# Patient Record
Sex: Male | Born: 1950 | Race: White | Hispanic: No | Marital: Married
Health system: Southern US, Community
[De-identification: ages and names within clinical notes are randomized; demographics above are authoritative.]

## PROBLEM LIST (undated history)

## (undated) DIAGNOSIS — J181 Lobar pneumonia, unspecified organism: Secondary | ICD-10-CM

## (undated) DIAGNOSIS — I482 Chronic atrial fibrillation, unspecified: Secondary | ICD-10-CM

## (undated) DIAGNOSIS — N183 Chronic kidney disease, stage 3 unspecified: Secondary | ICD-10-CM

## (undated) DIAGNOSIS — J9621 Acute and chronic respiratory failure with hypoxia: Secondary | ICD-10-CM

---

## 2019-11-25 ENCOUNTER — Ambulatory Visit
Admit: 2019-11-25 | Payer: PRIVATE HEALTH INSURANCE | Attending: Student in an Organized Health Care Education/Training Program | Primary: Internal Medicine

## 2019-11-25 ENCOUNTER — Inpatient Hospital Stay: Admit: 2019-11-25 | Discharge: 2019-11-25 | Payer: MEDICARE | Primary: Internal Medicine

## 2019-11-25 DIAGNOSIS — F419 Anxiety disorder, unspecified: Secondary | ICD-10-CM

## 2019-11-25 DIAGNOSIS — F1121 Opioid dependence, in remission: Secondary | ICD-10-CM

## 2019-11-25 NOTE — Group Note
Group Session:  Alcohol/Substance Abuse RecoveryGroup Start Time:    9:00 AM      End Time:  10:30 AMFacilitators:  Renato Gails, LADC This clinician is conducting this visit at a site within our enrolled clinical location.. For this visit the clinician and patient were present via interactive audio & video telecommunications system that permits real-time communications. This visit type was necessary for this patient in place of an in-person visit due to the COVID-19 outbreak. Patient counseled on available options for visit type; Patient elected video visit and was made aware they are able to opt out or refuse this service at any time; Patient consent given for video visit: Yes. The patient?s identity was verified by asking the patient to state their name and date of birth at the start of the encounter.State patient is located in: [place] clinician is appropriately licensed in the above state to provide care for this visit.Other individuals present during the telehealth encounter and their role/relation:other group members, Patrica Duel RNSession Focus Relapse preventionPsychoeducation and group discussion on DBT concept of Wise Mind, rational mind and Emotional mind  Number of Participants 7 Treatment Modality Group Therapy Interventions/Education Relapse Prevention InterventionsEducated clients of the benefits of joining a support groupHelped patient become skilled in recognizing/process/coping with thoughts/feelingsIdentified alternative activities to structure leisure timeIdentified positive outcomes for refraining from substances Attendance            AttendedEngagement InterventionsNot applicable - patient attended group without difficulty Degree of Participation Active Behavior Appropriate Speech/Thought Process Focused Affect/Mood Full range Insight Good Judgment Good Response to Interventions Attentive Individualization [place] - Suicide Severity Screen    Most Recent Value Have you wished you were dead or wished you could go to sleep and not wake up?  No Have you actually had any thoughts of killing yourself?   No Have you ever done anything, started to do anything, or prepared to do anything to end your life?   No [place] Suicide Risk Level  Low Risk  pt reports continued sobriety;compliant with MAT; states he was pleasantly surprised to see that his wife had already cleaned his car off of snow by the time he went to do it; feeling better and less tired since he had his appt with his doctor and the blood tests came back with no major concerns; Plan Continue to assist patient with relapse prevention

## 2019-12-01 ENCOUNTER — Encounter: Admit: 2019-12-01 | Payer: PRIVATE HEALTH INSURANCE | Attending: Psychiatry | Primary: Internal Medicine

## 2019-12-01 MED ORDER — BUPRENORPHINE 4 MG-NALOXONE 1 MG SUBLINGUAL FILM
4-1 mg | ORAL_FILM | Freq: Every day | SUBLINGUAL | 1 refills | Status: AC
Start: 2019-12-01 — End: 2020-01-01

## 2019-12-02 ENCOUNTER — Inpatient Hospital Stay: Admit: 2019-12-02 | Discharge: 2019-12-02 | Payer: MEDICARE | Primary: Internal Medicine

## 2019-12-02 ENCOUNTER — Ambulatory Visit
Admit: 2019-12-02 | Payer: PRIVATE HEALTH INSURANCE | Attending: Student in an Organized Health Care Education/Training Program | Primary: Internal Medicine

## 2019-12-02 DIAGNOSIS — F419 Anxiety disorder, unspecified: Secondary | ICD-10-CM

## 2019-12-02 DIAGNOSIS — F1121 Opioid dependence, in remission: Secondary | ICD-10-CM

## 2019-12-09 ENCOUNTER — Inpatient Hospital Stay: Admit: 2019-12-09 | Discharge: 2019-12-09 | Payer: MEDICARE | Primary: Internal Medicine

## 2019-12-09 ENCOUNTER — Ambulatory Visit
Admit: 2019-12-09 | Payer: PRIVATE HEALTH INSURANCE | Attending: Student in an Organized Health Care Education/Training Program | Primary: Internal Medicine

## 2019-12-09 DIAGNOSIS — F1121 Opioid dependence, in remission: Secondary | ICD-10-CM

## 2019-12-09 NOTE — Group Note
Group Session:  Alcohol/Substance Abuse RecoveryGroup Start Time:    9:00 AM      End Time:  10:30 AMFacilitators:  Renato Gails, LADC This clinician is conducting this visit at a site within our enrolled clinical location.. For this visit the clinician and patient were present via interactive audio & video telecommunications system that permits real-time communications. This visit type was necessary for this patient in place of an in-person visit due to the COVID-19 outbreak. Patient counseled on available options for visit type; Patient elected video visit and was made aware they are able to opt out or refuse this service at any time; Patient consent given for video visit: Yes. The patient?s identity was verified by asking the patient to state their name and date of birth at the start of the encounter.State patient is located in: [place]The clinician is appropriately licensed in the above state to provide care for this visit.Other individuals present during the telehealth encounter and their role/relation:other group membersSession Focus Relapse preventionGroup discussed meaning attribution and how to assign meaning to seemingly discouraging situations to make them easier to cope with Number of Participants 8 Treatment Modality Group Therapy Interventions/Education Relapse Prevention InterventionsEducated clients of the benefits of joining a support groupHelped patient become skilled in recognizing/process/coping with thoughts/feelingsIdentified positive outcomes for refraining from substancesIdentified thoughts/feelings that lead to relapse Attendance            AttendedEngagement InterventionsNot applicable - patient attended group without difficulty Degree of Participation Moderate Behavior Cooperative--pt was lying down for the group- stated he is tired Speech/Thought Process Focused Affect/Mood Full range Insight Moderate Judgment Moderate Response to Interventions Attentive Individualization [place] - Suicide Severity Screen    Most Recent Value Have you wished you were dead or wished you could go to sleep and not wake up?  No Have you actually had any thoughts of killing yourself?   No Have you ever done anything, started to do anything, or prepared to do anything to end your life?   No [place] Suicide Risk Level  Low Risk  Pt reports continued sobriety; states that he is tired physically and of the limitations imposed by COVID and inability to get his vaccine; shared that he misses visiting his friend in th nursing home and fears that he might die there alone Plan Continue to assist patient with relapse prevention

## 2019-12-15 ENCOUNTER — Inpatient Hospital Stay: Admit: 2019-12-15 | Discharge: 2019-12-15 | Payer: MEDICARE | Primary: Internal Medicine

## 2019-12-15 DIAGNOSIS — F1121 Opioid dependence, in remission: Secondary | ICD-10-CM

## 2019-12-15 DIAGNOSIS — F419 Anxiety disorder, unspecified: Secondary | ICD-10-CM

## 2019-12-15 LAB — URINE DRUG SCREEN, MEDTOX     (GH)
BKR AMPHETAMINE: NEGATIVE
BKR BARBITURATES: NEGATIVE
BKR BENZODIAZEPINES: POSITIVE — AB
BKR BUPRENORPHINE: NEGATIVE
BKR COCAINE (BENZOYLECGONINE): NEGATIVE
BKR METHADONE: NEGATIVE
BKR METHAMPHETAMINE: NEGATIVE
BKR OPIATES: NEGATIVE
BKR OXYCODONE: NEGATIVE
BKR PCP, PHENCYCLIDINE: NEGATIVE
BKR PROPOXYPHENE: NEGATIVE
BKR TCA, TRICYCLIC ANTIDEPRESSANTS: NEGATIVE
BKR THC, CANNABINOIDS: NEGATIVE

## 2019-12-15 LAB — ETHANOL, URINE, RANDOM     (BH GH LMW): BKR ETHANOL URINE: NEGATIVE

## 2019-12-15 NOTE — Progress Notes
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 Endorsing euthymic mood, but is bit more tired overall. Reported that he had been using more Librium PRN recently. Counseled to reduce the use. Well tolerating other medications and denies side effects. Denies craving. Patient self-lowered Suboxone to 2/0.5 for a while, still taking 4/1 sometimes. Agreed with the taper and will continue to counsel. Church is a main support for her. Denies acute stress. Denies SI. Sleeps well.REVIEW OF ALLERGIES/MEDICATIONS/HISTORY I have reviewed the patient's current medications and allergiesI have reviewed with the patient, the risks and benefits of the medications prescribed.OBJECTIVE Review of SystemsReview of Systems Constitutional: Negative for activity change and fatigue. Respiratory: Negative for cough.  Skin: Negative for pallor. Psychiatric/Behavioral: Negative for confusion, decreased concentration, dysphoric mood, sleep disturbance and suicidal ideas. The patient is not nervous/anxious.   Labs12/24/2020 urine toxicology positive benzodiazepines, negative buprenorphine.  Labs have periodically tested negative for buprenorphine as patient has been on longitudinally at relatively low dose.  Patient prescribed Librium so would expect benzodiazepines to be positive.Current MedicationsCurrent Outpatient Medications Medication Sig Note ? acetaminophen (TYLENOL) 325 MG tablet Take 650 mg by mouth every 6 (six) hours as needed..  ? buprenorphine-naloxone (SUBOXONE) 4-1 mg per sublingual film Place 1 Film under the tongue daily.  ? buPROPion XL (WELLBUTRIN XL) 300 mg 24 hr tablet Take 1 tablet (300 mg total) by mouth every morning.  ? caffeine 200 mg Tab tablet Take 200 mg by mouth daily.  ? calcium carbonate (TUMS) *200 mg CALCIUM* chewable tablet Take 3 tablets by mouth daily.  ? chlordiazePOXIDE (LIBRIUM) 25 MG capsule Take 25 mg by mouth 2 (two) times daily as needed for Anxiety.  ? clobetasol (TEMOVATE) 0.05 % cream APPLY SPARINGLY TO AFFECTED AREA TWICE A DAY  ? co-enzyme Q-10 30 mg capsule Take 30 mg by mouth daily.  ? escitalopram oxalate (LEXAPRO) 10 mg tablet Take 1 tablet (10 mg total) by mouth every morning.  ? fish oil-omega-3 fatty acids 1,000 mg capsule Take 2 g by mouth daily.  ? furosemide (LASIX) 40 MG tablet TAKE 1 AND 1/2 TABLETS ORALLY DAILY AS DIRECTED 07/23/2016: Received from: External Pharmacy ? glucosamine-chondroitin 500-400 mg tablet Take 2 tablets by mouth daily.  ? MATZIM LA 360 mg LA 24 hr extended release tablet   ? mupirocin (BACTROBAN) 2 % ointment   ? tadalafil (CIALIS) 20 MG tablet Take 20 mg by mouth as needed for Erectile Dysfunction.  ? warfarin (COUMADIN) 6 MG tablet TAKE 1 TABLET BY MOUTH EVERY DAY  No current facility-administered medications for this encounter.  Mental Status ExamGeneral AppearanceHabitus:  MediumGrooming:  GoodMusculoskeletalStrength and Tone: video visit.Gait and Station: Stable posturePsychiatricAttitude: Cooperative and pleasantPsychomotor Behavior: No psychomotor activation or retardationSpeech: normal rate, volume and prosodyMood: Calm and euthymic not depressed, not anxious   Patient reported mood:  okayAffect: speech volume, prosody, emotional content congruent with subject matter. No lability.Thought Process: Coherent, logical, goal directedAssociations: NormalThought Content: NormalSuicidal Ideation: No current suicidal plan, ideation or intentHomicidal Ideation: No current homicidal ideation, plan or intentJudgment:  GoodInsight:  GoodCognitive EvaluationOrientation: Oriented to date/time, oriented to person and oriented to place  Attention and Concentration:  Normal attention and concentrationMemory: Recent and remote memory intactSafety and Risk AssessmentPSY RISK ASSESSMENT SAFE-T WITH C-SSRS  Reason for Assessment:  Routine Ambulatory Psychiatric AssessmentC-SSRS: Suicidal Ideation:  Since Last Visit  WISH TO BE DEAD:  No  CURRENT SUICIDAL THOUGHTS:  No  Have you ever done anything, started to do anything or prepared to do anything to end your life?:  NoRisk Assessment:   Access to Lethal Methods:  NoCurrent and Past Psychiatric Diagnoses:    Mood Disorder:  Recurrent/Current  Psychotic Disorder:  No  Alcohol/Substance Abuse Disorders:  Sustained Remission  PTSD:  No  ADHD:  Defer for Further Assessment  TBI:  No  Cluster B Personality Disorders or Traits:  No  Conduct Problems:  No  Suicide Attempt:  No Prior Attempts  Presenting Symptoms:  Anhedonia:  No  Impulsivity:  No  Hopelessness or Despair:  No  Anxiety and/or Panic:  No  Insomnia:  No  Command Hallucinations:  No  Psychosis:  No  Self-injurious Behavior:  No  Physical Aggression Toward Others:  No  Violence, Victimization and/or Perpetration:  No  Family History:   Suicide:  No  Suicidal Behavior:  No  Axis I Psychiatric Diagnosis requiring hospitalization:  No  Precipitants / Stressors:   Triggering events leading to humiliation, shame and/or despair:  No  Chronic physical pain or other acute medical problem:  Yes  Sexual / Physical Abuse:  No  Substance Intoxication or Withdrawal:  No  Pending Incarceration:  No  Legal Problems:  No  Inadequate Social Supports:  No  Social Isolation:  No  Perceived burden on others:  No  Bullying / Cyberbullying:  No  Media portrayal of suicide:  No  Housing Insecurity:  No  Financial Insecurity:  No  Stressful Life Events:  No  Gender / Sexual Identity:  No  Homelessness:  No  Change in Treatment:    Recent inpatient discharge:  No  Change in provider or treatment:  No  Hopeless or dissatisfied with provider or treatment:  No  Non-compliant:  No  Not receiving treatment:  NoCurrent and Past Psychiatric Diagnoses:  Mood Disorder:  Recurrent/Current  Psychotic Disorder:  No  Alcohol/Substance Abuse Disorders:  Sustained Remission  PTSD:  No  ADHD:  Defer for Further Assessment  TBI:  No  Cluster B Personality Disorders or Traits:  No  Conduct Problems:  No  Suicide Attempt:  No Prior AttemptsProtective Factors:   Internal:   Ability to cope with stress:  Yes  Frustration tolerance:  Yes  Religious beliefs:  Yes  Fear of death or the actual act of killing self:  Yes  Identifies reasons for living:  Yes  Problem solving skills:  Yes  External:   Cultural, spiritual and/or moral attitudes against suicide:  Yes  Responsibility to children:  Yes  Beloved pets:  No  Supportive social network of friends or family:  Yes  Positive therapeutic relationships / Effective mental healthcare:  Yes  Step 2 External - Engaged in work/school: n/a.Risk to Self - Self-Injurious Behavior:   Current Urges to harm Self:  No  Recent Self-Injury:  No  History of Self Injury:  No  Attitude Regarding Self Injury:  Ego dystonic  Imminent Risk for Self Injury in Community:  Low  Imminent Risk for Self Injury in Facility:  LowRisk to Others:   Current Agitation:  No  Homicidal/Aggressive Ideation:  No  Homicidal/Aggressive Threat/Plan:  No  Recent Violence/Aggression:  No  Attitude Regarding Aggression / Violence:  Ego dystonic  Imminent Risk for Violence in Community:  Low  Imminent Risk for Violence in Facility:  LowWithdrawal Risk:   Alcohol / Benzodiazepines / Barbiturates:  Daily use (prescribed librium)     Alcohol/Benzodiazepine/Barbiturate Risk for Withdrawal:  Low  Opioids:  Daily use (prescribed suboxone)     Opioid Risk for Withdrawal:  LowMEDICAL DECISION MAKING DiagnosisProblem List         ICD-10-CM   Y IOP/PHP THERAPY PLAN  Opioid use disorder, moderate, in sustained remission, on maintenance therapy, 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 Patient doing well overall.  Mood has been euthymic, but energy level is low. Advised to lower PRN Librium if doable. Is self tapering down Suboxone to 2/0.5. Agreed. Will continue counseling for Suboxone taper. No cravings with lowered dose so far. Continues to maintain sobriety. Good supports through his church.  The Self-Report [place] Risk Suicide Severity Rating Scale Since Last Visit was completed by Tyler Khan and reviewed by myself today.  Additionally, risk assessment from Tyler Khan's intake assessment and follow up screenings have been reviewed.  Based on Tyler Khan?s clinical presentation today as well as his self-reported symptom ratings today and over the current course of treatment, I have assessed Tyler Khan to be at unchanged risk of harm to self as compared with prior recent assessments.  Based on this assessment, I have continued current treatment plan including implementation of factors to mitigate risks of harm to self as outlined in the last risk assessmentIntervention(s)Medication managementLabs/Test/Consults OrderedToxicologyMedication ChangesnonePlanNo Changes:-continue prescription of buprenorphine-naloxone 4-1mg  daily. Patient is trying to lower the dose to half. Last filled on 2/9-continue escitalopram 10mg  daily-continue Wellbutrin XL 300mg  daily- Librium 25 mg BID PRN for Crohn's. (Dr. Jacklynn Lewis CCG; group conducted by video due to outbreak-encouraged [facility] via video-RTC 8 weeks, call earlier as needed for medication refill etc.Electronically SignedProvider:  Hayes Ludwig, MD 2/23/202111:02 AM

## 2019-12-16 ENCOUNTER — Ambulatory Visit
Admit: 2019-12-16 | Payer: PRIVATE HEALTH INSURANCE | Attending: Student in an Organized Health Care Education/Training Program | Primary: Internal Medicine

## 2019-12-16 DIAGNOSIS — F1121 Opioid dependence, in remission: Secondary | ICD-10-CM

## 2019-12-16 DIAGNOSIS — F419 Anxiety disorder, unspecified: Secondary | ICD-10-CM

## 2019-12-18 LAB — ZZZALCOHOL METABOLITES, QUANT, URINE     (BH GH): ETHYL SULFATE: NEGATIVE ng/mL (ref <100)

## 2019-12-18 LAB — ALCOHOL METABOLITES, QUANT, URINE     (BH GH): ETHYL GLUCURONIDE: NEGATIVE ng/mL (ref <500)

## 2019-12-20 DIAGNOSIS — Z79899 Other long term (current) drug therapy: Secondary | ICD-10-CM

## 2019-12-20 DIAGNOSIS — F1121 Opioid dependence, in remission: Secondary | ICD-10-CM

## 2019-12-20 DIAGNOSIS — F419 Anxiety disorder, unspecified: Secondary | ICD-10-CM

## 2019-12-23 ENCOUNTER — Ambulatory Visit
Admit: 2019-12-23 | Payer: PRIVATE HEALTH INSURANCE | Attending: Student in an Organized Health Care Education/Training Program | Primary: Internal Medicine

## 2019-12-23 ENCOUNTER — Inpatient Hospital Stay: Admit: 2019-12-23 | Discharge: 2019-12-23 | Payer: MEDICARE | Primary: Internal Medicine

## 2019-12-23 DIAGNOSIS — F1121 Opioid dependence, in remission: Secondary | ICD-10-CM

## 2019-12-23 DIAGNOSIS — F1120 Opioid dependence, uncomplicated: Secondary | ICD-10-CM

## 2019-12-24 ENCOUNTER — Encounter: Admit: 2019-12-24 | Payer: PRIVATE HEALTH INSURANCE | Primary: Internal Medicine

## 2019-12-24 NOTE — Group Note
Group Session:  Alcohol/Substance Abuse RecoveryGroup Start Time:    9:00 AM      End Time:  10:30 AMFacilitators:  Renato Gails, LADC This clinician is conducting this visit at a site within our enrolled clinical location.. For this visit the clinician and patient were present via interactive audio & video telecommunications system that permits real-time communications. This visit type was necessary for this patient in place of an in-person visit due to the COVID-19 outbreak. Patient counseled on available options for visit type; Patient elected video visit and was made aware they are able to opt out or refuse this service at any time; Patient consent given for video visit: Yes. The patient?s identity was verified by asking the patient to state their name and date of birth at the start of the encounter.?State patient is located in: [place]The clinician is appropriately licensed in the above state to provide care for this visit.Session Focus Relapse preventionGroup processed recent death of group memberGroup discussed self compassion and taming their inner critic Number of Participants 7 Treatment Modality Group Therapy Interventions/Education Relapse Prevention InterventionsHelped patient become skilled in recognizing/process/coping with thoughts/feelingsHelped patient increase awareness of support groupsIdentified positive outcomes for refraining from substancesIdentified thoughts/feelings that lead to relapseSubstance Abuse Recovery InterventionsDemonstrated ability to tolerate uncomfortable emotions that arise in groupDiscussed the co-existing mental illness and substance abuseIdentified social supportsIncreased the ability for self-observation Attendance            AttendedEngagement InterventionsNot applicable - patient attended group without difficulty Degree of Participation Moderate Behavior Appropriate Speech/Thought Process Focused Affect/Mood Full range Insight Moderate Judgment Good Response to Interventions Attentive Individualization [place] - Suicide Severity Screen    Most Recent Value Have you wished you were dead or wished you could go to sleep and not wake up?  No Have you actually had any thoughts of killing yourself?   No Have you ever done anything, started to do anything, or prepared to do anything to end your life?   No [place] Suicide Risk Level  Low Risk  Pt reports continued sobriety, compliant with MAT; shared that he was sick last week with low grade fever, feels better but tired; feels supported and happy with the care from his PCP; saddened by news of group member's death; supportive of peers Plan Continue to assist patient with relapse prevention

## 2019-12-28 NOTE — Other
Outpatient Interdisciplinary Treatment PlanJames Richard [id number]/4/2021Overview: Current Diagnosis:Problem List         ICD-10-CM   Y IOP/PHP THERAPY PLAN  Opioid use disorder, moderate, in sustained remission, on maintenance therapy, dependence (HC Code) F11.21   INFUSION TREATMENT  Cellulitis of right leg L03.115  Level of Care/Treatment Track: ARC CCGFrequency: Once a weekEstimated duration/length of stay in the program: 3 monthsTreatment Modalities: Group PsychotherapyPatient and/or Family Stated Short Term Personal Goal(s): I want to be content and grateful for what I have...per ptPatient and/or Family Stated Long Term Personal Goal(s): I want to rely on good coping skills to manage my pain and discomfort...per ptGoal of Treatment #1: Relapse preventionGoal of Treatment #2: medication assisted treatmentGoal of Treatment #3: increase sober supportThe patient did participate in the development of this Treatment Plan.Active Multidisciplinary Problems: INTERDISCIPLINARY PROBLEM LISTSubstance Use / Abuse:  ActiveDescriptive Behaviors:  Pt has hx of opiate dependence and has been on MAT at [facility] since 2014; Pt rpeorts compliance with MAT and keeps scheduled appts with dr Helmut Muster Pt's UDS +ve for alcohol on 10/29/2018. Pt stated that he had drank alcohol over the holidays out of boredom ad sense of feeling deprived because of several limitations related to Crohn's disease. Pt understands the risks of continued alcohol use including cross addiction; states intent to not drink alcoholPt identifes loneliness and lack of fulfillment in his marriage as stressorsPt does Meals on Wheels, is ery involved with his church and has a supportive relationship with his daughter in [place] who recently has her first child.Pt's wife is going for family [place] pt will visit his daughter during that timePt used to attend [employer]; has not gone recently; pt continues to be encouraged to resume sober support groups5/4/2020Pt reports staying sober, denies cravings or thoughts of drinking alcohol,compliant with MAT, verbalizes frustration over being essentially confined to his house due to COVID 19 public health guidelines; identifies boredom as stressor; stays connected with [employer] and cingregation through [employer]; also facetimes regularly with his daughter in [place]; disappointed tat he had to cancel his trip to visit her and his grandson.Pt coping adaptively with his feelings; pt continues to be encouraged to attend sober support groups online; 7/30/2020Pt reports continued sobriety, compliance with MAT; articulates feelings of boredom and occasional isolation related to the limitations imposed by the pandemic, expresses coping as best as I can; presents as coping adaptively, taking care of tasks that he had been putting off, staying connected with his children and Church; pt continues to be encouraged to resume Haematologist meetings to increase sober support network10/5/2020Pt reports continued sobriety; compliant with MAT;identifies boredom as a trigger; pt has been working through low mood and lethargy; stated he was sick last 2 weeks, COVID test was negative; states he is feeling better, enjoys out doors in person [employer]; identifies it as a main support system; continues to be encouraged to attend sober support groups online or in person12/2020Pt reports continued sobriety, compliant with MAT, reports occasional depressed mood due to limitations/restrictions caused by pandemic;reports feeling more hopeful when he thinks of how close we are t getting a vaccine states that it does not last for more than a couple of days at a time; identifies going to church in person and meeting up with his friends there as uplifting; does virtual bible study classes every Wednesdays, misses doing meals on wheels; states he stays busy getting things done around the house and feels good when he tackles  small tasks like clearing the clutter from his table; enjoys zoom calls with his daughters; pt continues to be encouraged to attend sober support groups online3/2021Pt reports continued sobriety, compliant with MAT, increasing discouraged by limitations due to COVID19- hopeful of getting ting vaccinated soon; working through Therapist, sports; feels supported and well taken care of by PCP; identifies his faith and church activities as his main coping skills; enjoys face time with his daughter and grandson in [place]; pt continues to be reluctant to engage in online/i person sober support groupsPatient Stated Goal(s):  To stay sober and be happy and have a sense of purpose...per ptLong Term Goal(s):  Medication compliance/adjustments, Prevent recurrence/exacerbation of symptoms and Caregiver engagement in discharge plan/follow-up treatment     Target Date:  10/2021Short Term Goal/Objective:  Patient will report a decrease in substance use as evidenced by ? self report, -ve UDS and breathalyzers     Target Date: 03/2020     Status:  Achieved and ongoingShort Term Goal/Objective:  Patient will identify/practice/demonstrate the use of adaptive coping skills as evidenced by ? verbalizing and demonstrating emotional regulation and stress management skills     Target Date: 03/2020     Status:  Partially achievedShort Term Goal/Objective:  Patient will increase their sober support network as evidenced by ? attending online sober support groups 1-2 x weekly     Target Date: 03/2020     Status:  Minimal progressIntervention/Frequency:  Encourage identification/practice/mastery of adaptive coping skills weekly by phone/ zoom video due to COVID 19 precautionsIntervention/Frequency:  Educate patients on the benefits of joining a support group and utilizing support systems weekly by phone/zoom video due to COVID 19 precautionsIntervention/Frequency:  Encourage patient to Identify connection between feeling, thoughts and behaviors weekly by phone/zoom video due to COVID 19 precautionsOther Assessed Need(s) (Medical/Psychosocial)(List any other assessed needs that were identified in the assessment of the patient that are NOT included in the Interdisciplinary Problem List noted above.  Indicate what the INTERVENTION, REFERRAL or DEFERRAL will be for each of the other Assessed Needs identified (e.g. name of PCP, other medical provider, therapist, community resource, etc.)No other assessed need identifiedMedical / Psychological Assessments: Medications:Assessment of medication side effects and/or adverse medication reactions is ongoing     Current Outpatient Medications Medication Sig Note ? acetaminophen (TYLENOL) 325 MG tablet Take 650 mg by mouth every 6 (six) hours as needed..  ? buprenorphine-naloxone (SUBOXONE) 4-1 mg per sublingual film Place 1 Film under the tongue daily.  ? buPROPion XL (WELLBUTRIN XL) 300 mg 24 hr tablet Take 1 tablet (300 mg total) by mouth every morning.  ? caffeine 200 mg Tab tablet Take 200 mg by mouth daily.  ? calcium carbonate (TUMS) *200 mg CALCIUM* chewable tablet Take 3 tablets by mouth daily.  ? chlordiazePOXIDE (LIBRIUM) 25 MG capsule Take 25 mg by mouth 2 (two) times daily as needed for Anxiety.  ? clobetasol (TEMOVATE) 0.05 % cream APPLY SPARINGLY TO AFFECTED AREA TWICE A DAY  ? co-enzyme Q-10 30 mg capsule Take 30 mg by mouth daily.  ? escitalopram oxalate (LEXAPRO) 10 mg tablet Take 1 tablet (10 mg total) by mouth every morning.  ? fish oil-omega-3 fatty acids 1,000 mg capsule Take 2 g by mouth daily.  ? furosemide (LASIX) 40 MG tablet TAKE 1 AND 1/2 TABLETS ORALLY DAILY AS DIRECTED 07/23/2016: Received from: External Pharmacy ? glucosamine-chondroitin 500-400 mg tablet Take 2 tablets by mouth daily.  ? MATZIM LA 360 mg LA 24 hr extended release tablet   ?  mupirocin (BACTROBAN) 2 % ointment   ? tadalafil (CIALIS) 20 MG tablet Take 20 mg by mouth as needed for Erectile Dysfunction.  ? warfarin (COUMADIN) 6 MG tablet TAKE 1 TABLET BY MOUTH EVERY DAY  No current facility-administered medications for this encounter.  Consult / Referral Made:No Consults or Referrals made at this timeDischarge Planning: Anticipated Discharge Date: 3 monthsAnticipated Discharge Level of Care/ Disposition:Outpatient TherapyPlan: Pt will stay sober, stay compliant with MAT, attend [facility] weekly

## 2019-12-30 ENCOUNTER — Inpatient Hospital Stay: Admit: 2019-12-30 | Discharge: 2019-12-30 | Payer: MEDICARE | Primary: Internal Medicine

## 2019-12-30 ENCOUNTER — Ambulatory Visit
Admit: 2019-12-30 | Payer: PRIVATE HEALTH INSURANCE | Attending: Student in an Organized Health Care Education/Training Program | Primary: Internal Medicine

## 2019-12-30 DIAGNOSIS — F1121 Opioid dependence, in remission: Secondary | ICD-10-CM

## 2019-12-30 NOTE — Group Note
Group Session:  Alcohol/Substance Abuse RecoveryGroup Start Time:    9:00 AM      End Time:  10:30 AMFacilitators:  Renato Gails, LADC This clinician is conducting this visit at a site within our enrolled clinical location.. For this visit the clinician and patient were present via interactive audio & video telecommunications system that permits real-time communications. This visit type was necessary for this patient in place of an in-person visit due to the COVID-19 outbreak. Patient counseled on available options for visit type; Patient elected video visit and was made aware they are able to opt out or refuse this service at any time; Patient consent given for video visit: Yes. The patient?s identity was verified by asking the patient to state their name and date of birth at the start of the encounter.State patient is located in: [place] clinician is appropriately licensed in the above state to provide care for this visit.Other individuals present during the telehealth encounter and their role/relation:other group members and Patrica Duel RNSession Focus Relapse prevention Number of Participants 8 Treatment Modality Group Therapy Interventions/Education Relapse Prevention InterventionsEducated clients of the benefits of joining a support groupHelped patient become skilled in recognizing/process/coping with thoughts/feelingsHelped patient increase community based supportsHelped patient increase awareness of support groupsIdentified alternative activities for social connectionIdentified alternative activities to structure leisure timeIdentified positive outcomes for refraining from substancesIdentified thoughts/feelings that lead to relapse Attendance            AttendedEngagement InterventionsNot applicable - patient attended group without difficulty Degree of Participation Active Behavior Appropriate Speech/Thought Process Focused Affect/Mood Full range Insight Moderate Judgment Good Response to Interventions Attentive Individualization [place] - Suicide Severity Screen    Most Recent Value Have you wished you were dead or wished you could go to sleep and not wake up?  No Have you actually had any thoughts of killing yourself?   No Have you ever done anything, started to do anything, or prepared to do anything to end your life?   No [place] Suicide Risk Level  Low Risk  Pt reports continued sobriety; compliant with MAT; frustrated by difficulty in scheduling COVID19 vaccine, aware of the website and procedure; states that he misses peer from this group who died recently and to add to that another neighbor died in his 6s of a heart attack leaving behind young children; pt states that he is focusing on remembering how good [place] have it [place] and on organizing things to prepare to move closer to his daughter in [place];  Plan Continue to assist patient with relapse prevention

## 2020-01-01 ENCOUNTER — Encounter: Admit: 2020-01-01 | Payer: PRIVATE HEALTH INSURANCE | Attending: Psychiatry | Primary: Internal Medicine

## 2020-01-01 MED ORDER — BUPRENORPHINE 4 MG-NALOXONE 1 MG SUBLINGUAL FILM
4-1 mg | ORAL_FILM | Freq: Every day | SUBLINGUAL | 1 refills | Status: AC
Start: 2020-01-01 — End: 2020-02-03

## 2020-01-05 ENCOUNTER — Inpatient Hospital Stay: Admit: 2020-01-05 | Discharge: 2020-01-05 | Payer: MEDICARE | Primary: Internal Medicine

## 2020-01-05 LAB — URINE DRUG SCREEN W/ CONFIRM      (GH)
BKR AMPHETAMINE SCREEN, URINE: NEGATIVE
BKR BARBITURATE SCREEN, URINE: NEGATIVE
BKR BENZODIAZEPINE SCREEN, URINE: POSITIVE
BKR CANNABINOID SCREEN, URINE: NEGATIVE
BKR ETHANOL URINE: NEGATIVE
BKR METHADONE SCREEN, URINE: NEGATIVE
BKR OPIATE SCREEN, URINE: NEGATIVE
BKR PHENCYCLIDINE SCREEN, URINE: NEGATIVE

## 2020-01-05 LAB — ZZZURINE DRUG SCREEN W/ CONFIRM      (GH): BKR COCAINE SCREEN, URINE: NEGATIVE

## 2020-01-05 LAB — ETHANOL, URINE, RANDOM     (BH GH LMW): BKR ETHANOL URINE: NEGATIVE

## 2020-01-06 ENCOUNTER — Ambulatory Visit
Admit: 2020-01-06 | Payer: PRIVATE HEALTH INSURANCE | Attending: Student in an Organized Health Care Education/Training Program | Primary: Internal Medicine

## 2020-01-06 ENCOUNTER — Inpatient Hospital Stay: Admit: 2020-01-06 | Discharge: 2020-01-06 | Payer: MEDICARE | Primary: Internal Medicine

## 2020-01-06 DIAGNOSIS — F1120 Opioid dependence, uncomplicated: Secondary | ICD-10-CM

## 2020-01-06 DIAGNOSIS — F1121 Opioid dependence, in remission: Secondary | ICD-10-CM

## 2020-01-06 NOTE — Group Note
Group Session:  Alcohol/Substance Abuse RecoveryGroup Start Time:    9:00 AM      End Time:  10:30 AMFacilitators:  Renato Gails, LADCThis clinician is conducting this visit at a site within our enrolled clinical location.. For this visit the clinician and patient were present via interactive audio & video telecommunications system that permits real-time communications. This visit type was necessary for this patient in place of an in-person visit due to the COVID-19 outbreak. Patient counseled on available options for visit type; Patient elected video visit and was made aware they are able to opt out or refuse this service at any time; Patient consent given for video visit: Yes. The patient?s identity was verified by asking the patient to state their name and date of birth at the start of the encounter.State patient is located in: [place] clinician is appropriately licensed in the above state to provide care for this visit.Other individuals present during the telehealth encounter and their role/relation: none and other group members and RN Jefm Petty Focus Relapse prevention Number of Participants 5 Treatment Modality Group Therapy Interventions/Education Relapse Prevention InterventionsEducated clients of the benefits of joining a support groupEncouraged patient to obtain a sponsorHelped patient become skilled in recognizing/process/coping with thoughts/feelingsHelped patient identify relapse prevention strategiesHelped patient increase community based supportsHelped patient increase awareness of support groupsIdentified thoughts/feelings that lead to relapse Attendance            AttendedEngagement InterventionsNot applicable - patient attended group without difficulty Degree of Participation Active Behavior Appropriate Speech/Thought Process Focused Affect/Mood Full range Insight Moderate Judgment Good Response to Interventions Attentive Individualization [place] - Suicide Severity Screen    Most Recent Value Have you wished you were dead or wished you could go to sleep and not wake up?  No Have you actually had any thoughts of killing yourself?   No Have you ever done anything, started to do anything, or prepared to do anything to end your life?   No [place] Suicide Risk Level  Low Risk   Plan Continue to assist patient with relapse prevention

## 2020-01-08 LAB — BENZODIAZEPINE, URINE, CONFIRMATION (*LAB ORDER ONLY*)  (GH LMW)
ALPRAZOLAM GC/MS CONF: NEGATIVE
FLURAZEPAM GC/MS CONF: NEGATIVE
LORAZEPAM GC/MS CONF: NEGATIVE
NORDIAZEPAM GC/MS CONF: NEGATIVE
OXAZEPAM GC/MS CONF: NEGATIVE
TEMAZEPAM GC/MS CONF: NEGATIVE

## 2020-01-09 LAB — BUPRENORPHINE, QUANTITATIVE, URINE (BH GH LMW Q YH)
BUPRENORPHINE, QN, URINE: 7 ng/mL
NORBUPRENORPHINE: 15 ng/mL — ABNORMAL HIGH

## 2020-01-09 LAB — ALCOHOL METABOLITES, QUANT, URINE     (BH GH): ETHYL SULFATE: NEGATIVE ng/mL (ref <100)

## 2020-01-09 LAB — ZZZALCOHOL METABOLITES, QUANT, URINE     (BH GH): ETHYL GLUCURONIDE: NEGATIVE ng/mL (ref <500)

## 2020-01-13 ENCOUNTER — Ambulatory Visit
Admit: 2020-01-13 | Payer: PRIVATE HEALTH INSURANCE | Attending: Student in an Organized Health Care Education/Training Program | Primary: Internal Medicine

## 2020-01-13 ENCOUNTER — Inpatient Hospital Stay: Admit: 2020-01-13 | Discharge: 2020-01-13 | Payer: MEDICARE | Primary: Internal Medicine

## 2020-01-13 DIAGNOSIS — F1121 Opioid dependence, in remission: Secondary | ICD-10-CM

## 2020-01-13 NOTE — Group Note
Group Session:  Alcohol/Substance Abuse RecoveryGroup Start Time:    9:00 AM      End Time:  10:30 AMFacilitators:  Renato Gails, LADC This clinician is conducting this visit at a site within our enrolled clinical location.. For this visit the clinician and patient were present via interactive audio & video telecommunications system that permits real-time communications. This visit type was necessary for this patient in place of an in-person visit due to the COVID-19 outbreak. Patient counseled on available options for visit type; Patient elected video visit and was made aware they are able to opt out or refuse this service at any time; Patient consent given for video visit: Yes. The patient?s identity was verified by asking the patient to state their name and date of birth at the start of the encounter.State patient is located in: [place] clinician is appropriately licensed in the above state to provide care for this visit.Other individuals present during the telehealth encounter and their role/relation: other group members and Junious Dresser F RNSession Focus Relapse prevention Number of Participants 8 Treatment Modality Group Therapy Interventions/Education Relapse Prevention InterventionsEducated clients of the benefits of joining a support groupHelped patient become skilled in recognizing/process/coping with thoughts/feelingsHelped patient increase community based supportsIdentified alternative activities for social connectionIdentified alternative activities to structure leisure timeIdentified positive outcomes for refraining from substances Attendance            AttendedEngagement InterventionsNot applicable - patient attended group without difficulty Degree of Participation Active Behavior Appropriate Speech/Thought Process Focused Affect/Mood Full range Insight Moderate Judgment Moderate Response to Interventions Attentive Individualization [place] - Suicide Severity Screen    Most Recent Value Have you wished you were dead or wished you could go to sleep and not wake up?  No Have you actually had any thoughts of killing yourself?   No Have you ever done anything, started to do anything, or prepared to do anything to end your life?   No [place] Suicide Risk Level  Low Risk  pt reports continued sobriety, MAT compliant; states that he is feeling more hopeful and energetic than he did last week; getting his 1st COVID vaccine today; enjoyed going out with friends to a restaurant after  a whole year of being stuck at home; wished his daughter with who has a difficult relationship a happy birthday; disappointed that she did not want to facetime with him; going to [facility]; aware that he wants to focus on doing something different everyday so that he does not get bored and discouarged Plan Continue to assist patient with relapse prevention

## 2020-01-20 ENCOUNTER — Ambulatory Visit
Admit: 2020-01-20 | Payer: PRIVATE HEALTH INSURANCE | Attending: Student in an Organized Health Care Education/Training Program | Primary: Internal Medicine

## 2020-01-20 ENCOUNTER — Inpatient Hospital Stay: Admit: 2020-01-20 | Discharge: 2020-01-20 | Payer: MEDICARE | Primary: Internal Medicine

## 2020-01-20 DIAGNOSIS — F1120 Opioid dependence, uncomplicated: Secondary | ICD-10-CM

## 2020-01-20 DIAGNOSIS — F1121 Opioid dependence, in remission: Secondary | ICD-10-CM

## 2020-01-20 NOTE — Group Note
Group Session:  Alcohol/Substance Abuse RecoveryGroup Start Time:    9:00 AM      End Time:  10:30 AMFacilitators:  Renato Gails, LADC This clinician is conducting this visit at a site within our enrolled clinical location.. For this visit the clinician and patient were present via interactive audio & video telecommunications system that permits real-time communications. This visit type was necessary for this patient in place of an in-person visit due to the COVID-19 outbreak. Patient counseled on available options for visit type; Patient elected video visit and was made aware they are able to opt out or refuse this service at any time; Patient consent given for video visit: Yes. The patient?s identity was verified by asking the patient to state their name and date of birth at the start of the encounter.State patient is located in: [place] clinician is appropriately licensed in the above state to provide care for this visit.Other individuals present during the telehealth encounter and their role/relation: othSession Focus Relapse prevention Number of Participants 6 Treatment Modality Group Therapy Interventions/Education Relapse Prevention InterventionsEducated clients of the benefits of joining a support groupEncouraged patient to pursue a healthy lifestyleHelped patient become skilled in recognizing/process/coping with thoughts/feelingsHelped patient identify relapse prevention strategiesHelped patient develop strategies to successfully resist cravingsHelped patient increase awareness of support groupsIdentified and deactivated triggers to drug/alcohol useIdentified alternative activities for social connectionIdentified alternative activities to structure leisure timeIdentified positive outcomes for refraining from substances Attendance            AttendedEngagement InterventionsNot applicable - patient attended group without difficulty Degree of Participation Moderate Behavior Appropriate Speech/Thought Process Focused Affect/Mood Full range Insight Moderate Judgment Good Response to Interventions Attentive Individualization [place] - Suicide Severity Screen    Most Recent Value Have you wished you were dead or wished you could go to sleep and not wake up?  No Have you actually had any thoughts of killing yourself?   No Have you ever done anything, started to do anything, or prepared to do anything to end your life?   No [place] Suicide Risk Level  Low Risk  client reports continued sobriety, compliant with MAT; looking forward to [place] brunch with his neighbor; states that he took on more than he could handle this week to help a friend and realizes that he needs to lear say no without feeing guilty; feeling more positive than he has in a while, notices that he is more forward thinking and goal oriented; planning a tip to visit his daughter in [place] and his sister in [place] Plan Continue to assist patient with relapse prevention

## 2020-01-27 ENCOUNTER — Ambulatory Visit
Admit: 2020-01-27 | Payer: PRIVATE HEALTH INSURANCE | Attending: Student in an Organized Health Care Education/Training Program | Primary: Internal Medicine

## 2020-01-27 ENCOUNTER — Inpatient Hospital Stay: Admit: 2020-01-27 | Discharge: 2020-01-27 | Payer: MEDICARE | Primary: Internal Medicine

## 2020-01-27 DIAGNOSIS — F1121 Opioid dependence, in remission: Secondary | ICD-10-CM

## 2020-01-27 NOTE — Group Note
Group Session:  Alcohol/Substance Abuse RecoveryGroup Start Time:    9:00 AM      End Time:  10:30 AMFacilitators:  Renato Gails, LADC This clinician is conducting this visit at a site within our enrolled clinical location.. For this visit the clinician and patient were present via interactive audio & video telecommunications system that permits real-time communications. This visit type was necessary for this patient in place of an in-person visit due to the COVID-19 outbreak. Patient counseled on available options for visit type; Patient elected video visit and was made aware they are able to opt out or refuse this service at any time; Patient consent given for video visit: Yes. The patient?s identity was verified by asking the patient to state their name and date of birth at the start of the encounter.State patient is located in: [place] clinician is appropriately licensed in the above state to provide care for this visit.Other individuals present during the telehealth encounter and their role/relation: noSession Focus Relapse prevention Number of Participants 7 Treatment Modality Group Therapy Interventions/Education Relapse Prevention InterventionsEducated clients of the benefits of joining a support groupHelped patient become skilled in recognizing/process/coping with thoughts/feelingsHelped patient increase awareness of support groupsIdentified alternative activities to structure leisure timeIdentified positive outcomes for refraining from substances Attendance            AttendedEngagement InterventionsNot applicable - patient attended group without difficulty Degree of Participation Moderate Behavior Appropriate Speech/Thought Process Focused Affect/Mood Full range Insight Moderate Judgment Moderate Response to Interventions dozing Individualization [place] - Suicide Severity Screen    Most Recent Value Have you wished you were dead or wished you could go to sleep and not wake up?  No Have you actually had any thoughts of killing yourself?   No Have you ever done anything, started to do anything, or prepared to do anything to end your life?   No [place] Suicide Risk Level  Low Risk  pt reports continued sobriety; compliant with meds, enjoyed getting together with friends at his house for easter morning; unhappy that he and his wife got into an argument later that day which lasted into Monday; states that his wife fell late Monday and it helped them refocus on each other and how much they care for each other; did some gardening and felt his mood improving, rates it as 8/10 with 10 as very happy Plan Continue to assist patient with relapse prevention

## 2020-02-03 ENCOUNTER — Inpatient Hospital Stay: Admit: 2020-02-03 | Discharge: 2020-02-03 | Payer: MEDICARE | Primary: Internal Medicine

## 2020-02-03 ENCOUNTER — Ambulatory Visit
Admit: 2020-02-03 | Payer: PRIVATE HEALTH INSURANCE | Attending: Student in an Organized Health Care Education/Training Program | Primary: Internal Medicine

## 2020-02-03 ENCOUNTER — Encounter: Admit: 2020-02-03 | Payer: PRIVATE HEALTH INSURANCE | Attending: Psychiatry | Primary: Internal Medicine

## 2020-02-03 DIAGNOSIS — F1121 Opioid dependence, in remission: Secondary | ICD-10-CM

## 2020-02-03 MED ORDER — BUPRENORPHINE 4 MG-NALOXONE 1 MG SUBLINGUAL FILM
4-1 mg | ORAL_FILM | Freq: Every day | SUBLINGUAL | 1 refills | Status: AC
Start: 2020-02-03 — End: 2020-03-14

## 2020-02-03 NOTE — Group Note
Group Session:  Alcohol/Substance Abuse RecoveryGroup Start Time:    9:00 AM      End Time:  10:30 AMFacilitators:  Renato Gails, LADCThis clinician is conducting this visit at a site within our enrolled clinical location.. For this visit the clinician and patient were present via interactive audio & video telecommunications system that permits real-time communications. This visit type was necessary for this patient in place of an in-person visit due to the COVID-19 outbreak. Patient counseled on available options for visit type; Patient elected video visit and was made aware they are able to opt out or refuse this service at any time; Patient consent given for video visit: Yes. The patient?s identity was verified by asking the patient to state their name and date of birth at the start of the encounter.State patient is located in: [place] clinician is appropriately licensed in the above state to provide care for this visit.Other individuals present during the telehealth encounter and their role/relation: none and nurse Patrica Duel RN, other group membersSession Focus Relapse prevention Number of Participants 6 Treatment Modality Group Therapy Interventions/Education Relapse Prevention InterventionsEducated clients of the benefits of joining a support groupEncouraged patient to pursue a healthy lifestyleIdentified alternative activities to structure leisure timeIdentified positive outcomes for refraining from substances Attendance            attendedEngagement Interventionsn/a Degree of Participation active Behavior appropriate   Affect/Mood Full range Insight good Judgment good Response to Interventions attentive Individualization [place] - Suicide Severity Screen    Most Recent Value Have you wished you were dead or wished you could go to sleep and not wake up?  No Have you actually had any thoughts of killing yourself?   No Have you ever done anything, started to do anything, or prepared to do anything to end your life?   No [place] Suicide Risk Level  Low Risk   Plan Relapse prevention planning

## 2020-02-10 ENCOUNTER — Inpatient Hospital Stay: Admit: 2020-02-10 | Discharge: 2020-02-10 | Payer: MEDICARE | Primary: Internal Medicine

## 2020-02-10 NOTE — Group Note
Group Session:  Alcohol/Substance Abuse RecoveryGroup Start Time:    9:00 AM      End Time:  10:30 AMFacilitators:  Renato Gails, LADC This clinician is conducting this visit at a site within our enrolled clinical location.. For this visit the clinician and patient were present via interactive audio & video telecommunications system that permits real-time communications. This visit type was necessary for this patient in place of an in-person visit due to the COVID-19 outbreak. Patient counseled on available options for visit type; Patient elected video visit and was made aware they are able to opt out or refuse this service at any time; Patient consent given for video visit: Yes. The patient?s identity was verified by asking the patient to state their name and date of birth at the start of the encounter.[place] patient is located in: [place] clinician [place] licensed in the above state to provide care for this visit.Other individuals present during the telehealth encounter and their role/relation:Connie F RN, other group membersSession Focus Relapse preventionReviewed the importance of self care and support groups in recoveryGroup dicussed the importance of being vulnerable to help feel more connected in recovery Number of Participants 7 Treatment Modality Group Therapy Interventions/Education Relapse Prevention InterventionsHelped patient become skilled in recognizing/process/coping with thoughts/feelingsHelped patient identify relapse prevention strategiesHelped patient increase community based supportsIdentified alternative activities for social connectionIdentified alternative activities to structure leisure timeIdentified positive outcomes for refraining from substances Attendance            AttendedEngagement InterventionsNot applicable - patient attended group without difficulty Degree of Participation Active Behavior Appropriate Speech/Thought Process Tangential Affect/Mood Euthymic Insight Moderate Judgment Moderate Response to Interventions Attentive Individualization 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  No Have you actually had any thoughts of killing yourself?   No Have you ever done anything, started to do anything, or prepared to do anything to end your life?   No [place] Suicide Risk Level  Low Risk    Plan Continue to assist patient with relapse prevention

## 2020-02-17 ENCOUNTER — Inpatient Hospital Stay: Admit: 2020-02-17 | Discharge: 2020-02-17 | Payer: MEDICARE | Primary: Internal Medicine

## 2020-02-17 NOTE — Group Note
Group Session:  Alcohol/Substance Abuse RecoveryGroup Start Time:    9:00 AM      End Time:  10:30 AMFacilitators:  Renato Gails, LADC  This clinician is conducting this visit at a site within our enrolled clinical location.. For this visit the clinician and patient were present via interactive audio & video telecommunications system that permits real-time communications. This visit type was necessary for this patient in place of an in-person visit due to the COVID-19 outbreak. Patient counseled on available options for visit type; Patient elected video visit and was made aware they are able to opt out or refuse this service at any time; Patient consent given for video visit: Yes. The patient?s identity was verified by asking the patient to state their name and date of birth at the start of the encounter.State patient is located in: [place] clinician [place] appropriately licensed in the above state to provide care for this visit.Other individuals present during the telehealth encounter and their role/relation: none and other group members, connie F RNSession Focus Relapse preventionClients reviewed their week, identified the highlights and low points and discussed the coping skills they used to stay sober Number of Participants 7 Treatment Modality Group Therapy Interventions/Education Relapse Prevention InterventionsEducated clients of the benefits of joining a support groupHelped patient become skilled in recognizing/process/coping with thoughts/feelingsIdentified alternative activities for social connectionIdentified alternative activities to structure leisure timeIdentified positive outcomes for refraining from substances Attendance            AttendedEngagement InterventionsNot applicable - patient attended group without difficulty Degree of Participation Moderate Behavior Appropriate Speech/Thought Process Focused Affect/Mood Full range Insight Fair Judgment Fair Response to Interventions Attentive Individualization [place] - Suicide Severity Screen    Most Recent Value Have you wished you were dead or wished you could go to sleep and not wake up?  No Have you actually had any thoughts of killing yourself?   No Have you ever done anything, started to do anything, or prepared to do anything to end your life?   No [place] Suicide Risk Level  Low Risk   Plan Continue to assist patient with relapse prevention

## 2020-02-19 DIAGNOSIS — F1121 Opioid dependence, in remission: Secondary | ICD-10-CM

## 2020-02-24 ENCOUNTER — Inpatient Hospital Stay: Admit: 2020-02-24 | Discharge: 2020-02-24 | Payer: MEDICARE | Primary: Internal Medicine

## 2020-02-24 DIAGNOSIS — F1121 Opioid dependence, in remission: Secondary | ICD-10-CM

## 2020-02-24 NOTE — Group Note
Group Session:  Alcohol/Substance Abuse RecoveryGroup Start Time:    9:00 AM      End Time:  10:30 AMFacilitators:  Renato Gails, LADC This clinician is conducting this visit at a site within our enrolled clinical location.. For this visit the clinician and patient were present via interactive audio & video telecommunications system that permits real-time communications. This visit type was necessary for this patient in place of an in-person visit due to the COVID-19 outbreak. Patient counseled on available options for visit type; Patient elected video visit and was made aware they are able to opt out or refuse this service at any time; Patient consent given for video visit: No. The patient?s identity was verified by asking the patient to state their name and date of birth at the start of the encounter.State patient is located in: [place] clinician [place] appropriately licensed in the above state to provide care for this visit.Other individuals present during the telehealth encounter and their role/relation:other group  Members and Patrica Duel RNSession Focus Relapse preventionPts reviewed their weeks; shared their challenges in recovery and identified coping skills used to stay sober; pts gave supportive recovery feedback to peers Number of Participants 7 Treatment Modality Group Therapy Interventions/Education Relapse Prevention InterventionsEducated clients of the benefits of joining a support groupHelped patient become skilled in recognizing/process/coping with thoughts/feelingsHelped patient identify relapse prevention strategiesHelped patient increase community based supportsIdentified alternative activities to structure leisure timeIdentified positive outcomes for refraining from substances Attendance            AttendedEngagement InterventionsNot applicable - patient attended group without difficulty Degree of Participation Moderate Behavior Appropriate Speech/Thought Process Focused Affect/Mood Full range Insight Moderate Judgment Good Response to Interventions Attentive Individualization [place] - Suicide Severity Screen    Most Recent Value Have you wished you were dead or wished you could go to sleep and not wake up?  No Have you actually had any thoughts of killing yourself?   No Have you ever done anything, started to do anything, or prepared to do anything to end your life?   No [place] Suicide Risk Level  Low Risk  pt shared that he had a good week but 'did not get my taxes done; looking forward to seeing his sister, enjoyed a men's breakfast; staying connected with his children Plan Continue to assist patient with relapse prevention

## 2020-03-01 ENCOUNTER — Encounter: Admit: 2020-03-01 | Payer: PRIVATE HEALTH INSURANCE | Attending: Internal Medicine | Primary: Internal Medicine

## 2020-03-01 ENCOUNTER — Inpatient Hospital Stay: Admit: 2020-03-01 | Discharge: 2020-03-01 | Payer: MEDICARE | Primary: Internal Medicine

## 2020-03-01 DIAGNOSIS — M791 Myalgia, unspecified site: Secondary | ICD-10-CM

## 2020-03-01 DIAGNOSIS — I4891 Unspecified atrial fibrillation: Secondary | ICD-10-CM

## 2020-03-01 DIAGNOSIS — Z7901 Long term (current) use of anticoagulants: Secondary | ICD-10-CM

## 2020-03-01 DIAGNOSIS — K50018 Crohn's disease of small intestine with other complication: Secondary | ICD-10-CM

## 2020-03-01 DIAGNOSIS — D631 Anemia in chronic kidney disease: Secondary | ICD-10-CM

## 2020-03-01 DIAGNOSIS — R5383 Other fatigue: Secondary | ICD-10-CM

## 2020-03-01 DIAGNOSIS — Z932 Ileostomy status: Secondary | ICD-10-CM

## 2020-03-01 DIAGNOSIS — N189 Chronic kidney disease, unspecified: Secondary | ICD-10-CM

## 2020-03-01 LAB — PROTIME AND INR
BKR INR: 1.95 — ABNORMAL HIGH (ref 0.88–1.14)
BKR PROTHROMBIN TIME: 20.2 s — ABNORMAL HIGH (ref 9.4–12.0)

## 2020-03-01 LAB — CBC WITH AUTO DIFFERENTIAL
BKR WAM ABSOLUTE IMMATURE GRANULOCYTES: 0 x 1000/ÂµL (ref 0.0–0.2)
BKR WAM ABSOLUTE LYMPHOCYTE COUNT: 1 x 1000/ÂµL (ref 1.0–2.3)
BKR WAM ABSOLUTE NRBC: 0 x 1000/ÂµL (ref <=0.0)
BKR WAM ANALYZER ANC: 2.9 x 1000/ÂµL (ref 1.8–7.3)
BKR WAM BASOPHIL ABSOLUTE COUNT: 0 x 1000/ÂµL (ref 0.0–0.6)
BKR WAM BASOPHILS: 0.7 % (ref 0.0–2.0)
BKR WAM EOSINOPHIL ABSOLUTE COUNT: 0.1 x 1000/ÂµL (ref 0.0–0.4)
BKR WAM EOSINOPHILS: 2.2 % (ref 0.0–6.0)
BKR WAM HEMATOCRIT: 35.3 % — ABNORMAL LOW (ref 41.0–54.0)
BKR WAM HEMOGLOBIN: 11 g/dL — ABNORMAL LOW (ref 13.7–18.0)
BKR WAM IMMATURE GRANULOCYTES: 0.2 % (ref 0.0–2.0)
BKR WAM LYMPHOCYTES: 21.9 % (ref 14.0–43.0)
BKR WAM MCH (PG): 26.9 pg (ref 25.7–31.0)
BKR WAM MCHC: 31.2 g/dL — ABNORMAL LOW (ref 32.0–36.0)
BKR WAM MCV: 86.3 fL (ref 80.0–99.0)
BKR WAM MONOCYTE ABSOLUTE COUNT: 0.5 x 1000/ÂµL (ref 0.4–1.3)
BKR WAM MONOCYTES: 11.2 % (ref 0.0–14.0)
BKR WAM MPV: 12.9 fL — ABNORMAL HIGH (ref 9.8–12.3)
BKR WAM NEUTROPHILS: 63.8 % (ref 38.0–74.0)
BKR WAM NUCLEATED RED BLOOD CELLS: 0 % (ref 0.0–0.2)
BKR WAM PLATELETS: 99 x1000/ÂµL — ABNORMAL LOW (ref 140–446)
BKR WAM RDW-CV: 15.3 % — ABNORMAL HIGH (ref 11.5–14.5)
BKR WAM RED BLOOD CELL COUNT: 4.1 M/ÂµL — ABNORMAL LOW (ref 4.6–6.1)
BKR WAM WHITE BLOOD CELL COUNT: 4.6 x1000/ÂµL (ref 3.8–10.6)

## 2020-03-01 LAB — COMPREHENSIVE METABOLIC PANEL
BKR A/G RATIO: 1
BKR ALANINE AMINOTRANSFERASE (ALT): 28 U/L (ref 12–78)
BKR ALBUMIN: 3.5 g/dL (ref 3.4–5.0)
BKR ALKALINE PHOSPHATASE: 128 U/L (ref 20–135)
BKR ANION GAP: 7 (ref 5–18)
BKR ASPARTATE AMINOTRANSFERASE (AST): 26 U/L (ref 5–37)
BKR AST/ALT RATIO: 0.9
BKR BILIRUBIN TOTAL: 0.4 mg/dL (ref 0.0–1.0)
BKR BLOOD UREA NITROGEN: 31 mg/dL — ABNORMAL HIGH (ref 8–25)
BKR BUN / CREAT RATIO: 22.3 (ref 8.0–25.0)
BKR CALCIUM: 8.6 mg/dL (ref 8.4–10.3)
BKR CHLORIDE: 103 mmol/L (ref 95–115)
BKR CO2: 32 mmol/L (ref 21–32)
BKR CREATININE: 1.39 mg/dL — ABNORMAL HIGH (ref 0.50–1.30)
BKR EGFR (AFR AMER): >60 mL/min/{1.73_m2} (ref >60)
BKR EGFR (NON AFRICAN AMERICAN): 50 mL/min/{1.73_m2} (ref >60)
BKR GLOBULIN: 3.5 g/dL
BKR GLUCOSE: 98 mg/dL (ref 70–100)
BKR OSMOLALITY CALCULATION: 290 mosm/kg (ref 275–295)
BKR POTASSIUM: 3.5 mmol/L (ref 3.5–5.1)
BKR PROTEIN TOTAL: 7 g/dL (ref 6.4–8.2)
BKR SODIUM: 142 mmol/L (ref 136–145)

## 2020-03-01 LAB — IMMATURE PLATELET FRACTION (BH GH LMW YH)
BKR WAM IPF, ABSOLUTE: 8.3 x1000/ÂµL (ref <20.0)
BKR WAM IPF: 10.5 % — ABNORMAL HIGH (ref 1.2–8.6)

## 2020-03-01 LAB — PHOSPHORUS     (BH GH L LMW YH): BKR PHOSPHORUS: 3.2 mg/dL (ref 2.5–4.9)

## 2020-03-01 LAB — MAGNESIUM: BKR MAGNESIUM: 2.3 mg/dL — ABNORMAL HIGH (ref 1.4–2.2)

## 2020-03-02 ENCOUNTER — Inpatient Hospital Stay: Admit: 2020-03-02 | Discharge: 2020-03-02 | Payer: MEDICARE | Primary: Internal Medicine

## 2020-03-02 DIAGNOSIS — F1121 Opioid dependence, in remission: Secondary | ICD-10-CM

## 2020-03-02 NOTE — Group Note
Group Session:  Alcohol/Substance Abuse RecoveryGroup Start Time:    9:00 AM      End Time:  10:30 AMFacilitators:  Renato Gails, LADCThis clinician is conducting this visit at a site within our enrolled clinical location.. For this visit the clinician and patient were present via interactive audio & video telecommunications system that permits real-time communications. This visit type was necessary for this patient in place of an in-person visit due to the COVID-19 outbreak. Patient counseled on available options for visit type; Patient elected video visit and was made aware they are able to opt out or refuse this service at any time; Patient consent given for video visit: Yes. The patient?s identity was verified by asking the patient to state their name and date of birth at the start of the encounter.State patient is located in: [place]The clinician is appropriately licensed in the above state to provide care for this visit.Other individuals present during the telehealth encounter and their role/relation: none and other group members and Junious Dresser F RNSession Focus Relapse preventionPts identified challenges they had this week and the coping skills they used to copePsycho education and group discussion on Wise mind/ rational mind and emotional mind Number of Participants 6 Treatment Modality Group Therapy Interventions/Education Relapse Prevention InterventionsEducated clients of the benefits of joining a support groupHelped patient become skilled in recognizing/process/coping with thoughts/feelingsHelped patient identify relapse prevention strategiesIdentified positive outcomes for refraining from substancesIdentified thoughts/feelings that lead to relapse Attendance            AttendedEngagement InterventionsNot applicable - patient attended group without difficulty Degree of Participation Active Behavior Appropriate Speech/Thought Process Focused Affect/Mood Full range Insight Moderate Judgment Good Response to Interventions Attentive Individualization [place] - Suicide Severity Screen    Most Recent Value Have you wished you were dead or wished you could go to sleep and not wake up?  No Have you actually had any thoughts of killing yourself?   No Have you ever done anything, started to do anything, or prepared to do anything to end your life?   No [place] Suicide Risk Level  Low Risk  pt reports continued sobriety; concerned about health, proactive about scheduling and keeping MD appts; supportive of peers; Plan Continue to assist patient with relapse prevention

## 2020-03-03 LAB — ZINC: ZINC: 114 ug/dL (ref 60–130)

## 2020-03-04 LAB — SELENIUM SERUM/PLASMA: SELENIUM: 261 ug/L — ABNORMAL HIGH (ref 63–160)

## 2020-03-09 ENCOUNTER — Inpatient Hospital Stay: Admit: 2020-03-09 | Discharge: 2020-03-09 | Payer: MEDICARE | Primary: Internal Medicine

## 2020-03-09 NOTE — Group Note
Group Session:  Alcohol/Substance Abuse RecoveryGroup Start Time:    9:00 AM      End Time:  10:30 AMFacilitators:  Renato Gails, LADC This clinician is conducting this visit at a site within our enrolled clinical location.. For this visit the clinician and patient were present via interactive audio & video telecommunications system that permits real-time communications. This visit type was necessary for this patient in place of an in-person visit due to the COVID-19 outbreak. Patient counseled on available options for visit type; Patient elected video visit and was made aware they are able to opt out or refuse this service at any time; Patient consent given for video visit: Yes. The patient?s identity was verified by asking the patient to state their name and date of birth at the start of the encounter.State patient is located in: [place] clinician is appropriately licensed in the above state to provide care for this visit.Other individuals present during the telehealth encounter and their role/relation: nurse Hotel manager and other group membersSession Focus Relapse preventionPts reviewed highs and lows of their wee and identified coping skills usedPts identified people in their lives who handle their emotions in a healthy way and talked about what they would like to improve in terms of regulating their emotions Number of Participants 7 Treatment Modality Group Therapy Interventions/Education Relapse Prevention InterventionsHelped patient become skilled in recognizing/process/coping with thoughts/feelingsHelped patient draft a relapse prevention planHelped patient increase awareness of support groupsIdentified positive outcomes for refraining from substancesVerbalized details of the circumstances that led to the most recent relapseIncreased knowledge of addiction and process of recovery Attendance            AttendedEngagement InterventionsNot applicable - patient attended group without difficulty Degree of Participation Moderate Behavior Appropriate Speech/Thought Process Focused Affect/Mood Full range Insight Good Judgment Good Response to Interventions Attentive Individualization [place] - Suicide Severity Screen    Most Recent Value Have you wished you were dead or wished you could go to sleep and not wake up?  No Have you actually had any thoughts of killing yourself?   No Have you ever done anything, started to do anything, or prepared to do anything to end your life?   No [place] Suicide Risk Level  Low Risk  pt reports conitnued sobriety; states that the weather and things opening up are helping him feel hopeful despite his many health concerns; likes going to church in person and we sometime even give a few hugs which feels good; looking forward to seeing his sister this weekend when she comes from [place] to visit her son in [place]; identified his younger daughter as someone who handles her emotions well Plan Continue to assist patient with relapse prevention

## 2020-03-14 ENCOUNTER — Encounter: Admit: 2020-03-14 | Payer: PRIVATE HEALTH INSURANCE | Attending: Psychiatry | Primary: Internal Medicine

## 2020-03-14 MED ORDER — BUPRENORPHINE 4 MG-NALOXONE 1 MG SUBLINGUAL FILM
4-1 mg | ORAL_FILM | Freq: Every day | SUBLINGUAL | 1 refills | Status: AC
Start: 2020-03-14 — End: 2020-04-12

## 2020-03-15 ENCOUNTER — Encounter: Admit: 2020-03-15 | Payer: PRIVATE HEALTH INSURANCE | Primary: Internal Medicine

## 2020-03-16 ENCOUNTER — Inpatient Hospital Stay: Admit: 2020-03-16 | Discharge: 2020-03-16 | Payer: MEDICARE | Primary: Internal Medicine

## 2020-03-16 NOTE — Group Note
Group Session:  Alcohol/Substance Abuse RecoveryGroup Start Time:    9:00 AM      End Time:  10:30 AMFacilitators:  Renato Gails, LADCThis clinician is conducting this visit at a site within our enrolled clinical location.. For this visit the clinician and patient were present via interactive audio & video telecommunications system that permits real-time communications. This visit type was necessary for this patient in place of an in-person visit due to the COVID-19 outbreak. Patient counseled on available options for visit type; Patient elected video visit and was made aware they are able to opt out or refuse this service at any time; Patient consent given for video visit: Yes. The patient?s identity was verified by asking the patient to state their name and date of birth at the start of the encounter.[place] patient [place] located in: [place] clinician [place] licensed in the above state to provide care for this visit.Other individuals present during the telehealth encounter and their role/relation:other group members and nurse Patrica Duel RN Session Focus Relapse preventionPts checked in about their week, gave recovery feedback to peersPts identified things that they are working on letting go of to support/protect their recovery Number of Participants 7 Treatment Modality Group Therapy Interventions/Education Relapse Prevention InterventionsHelped patient become skilled in recognizing/process/coping with thoughts/feelingsHelped patient increase self awareness/cylce of relapseHelped patient develop strategies to successfully resist cravingsIdentified alternative activities to structure leisure timeIdentified positive outcomes for refraining from substancesIdentified thoughts/feelings that lead to relapse Attendance            AttendedEngagement InterventionsNot applicable - patient attended group without difficulty Degree of Participation Active Behavior Appropriate Speech/Thought Process Focused Affect/Mood Full range Insight Moderate Judgment Good Response to Interventions Attentive Individualization [place] - Suicide Severity Screen    Most Recent Value Have you wished you were dead or wished you could go to sleep and not wake up?  No Have you actually had any thoughts of killing yourself?   No Have you ever done anything, started to do anything, or prepared to do anything to end your life?   No [place] Suicide Risk Level  Low Risk  pt is sober, MAT compliant; letting go of perfect health because I have days when I feel more aches and pains; enjoyed seeing his sister; happy that things are returning to normal and no one is wearing masks in church Plan Continue to assist patient with relapse prevention

## 2020-03-16 NOTE — Other
Outpatient Interdisciplinary Treatment PlanJames Richard [id number]/25/2021Overview: Current Diagnosis:Problem List         ICD-10-CM   Y IOP/PHP THERAPY PLAN  Opioid use disorder, moderate, in sustained remission, on maintenance therapy, dependence (HC Code) F11.21   INFUSION TREATMENT  Cellulitis of right leg L03.115  Level of Care/Treatment Track: [facility]C CCGFrequency: Once a weekEstimated duration/length of stay in the program: 3 monthsTreatment Modalities: Group PsychotherapyPatient and/or Family Stated Short Term Personal Goal(s): I want to be content and grateful for what I have...per ptPatient and/or Family Stated Long Term Personal Goal(s): I want to rely on good coping skills to manage my pain and discomfort...per ptGoal of Treatment #1: Relapse preventionGoal of Treatment #2: medication assisted treatmentGoal of Treatment #3: increase sober supportThe patient did participate in the development of this Treatment Plan.Active Multidisciplinary Problems: INTERDISCIPLINARY PROBLEM LISTSubstance Use / Abuse:  ActiveDescriptive Behaviors:  Pt has hx of opiate dependence and has been on MAT at [facility] since 2014; Pt rpeorts compliance with MAT and keeps scheduled appts with dr Tyler Khan Pt's UDS +ve for alcohol on 10/29/2018. Pt stated that he had drank alcohol over the holidays out of boredom ad sense of feeling deprived because of several limitations related to Crohn's disease. Pt understands the risks of continued alcohol use including cross addiction; states intent to not drink alcoholPt identifes loneliness and lack of fulfillment in his marriage as stressorsPt does Meals on Wheels, is ery involved with his church and has a supportive relationship with his daughter in [place] who recently has her first child.Pt's wife is going for family reunion in [place] pt will visit his daughter during that timePt used to attend [employer]; has not gone recently; pt continues to be encouraged to resume sober support groups5/4/2020Pt reports staying sober, denies cravings or thoughts of drinking alcohol,compliant with MAT, verbalizes frustration over being essentially confined to his house due to COVID 19 public health guidelines; identifies boredom as stressor; stays connected with [employer] and cingregation through [employer]; also facetimes regularly with his daughter in [place]; disappointed tat he had to cancel his trip to visit her and his grandson.Pt coping adaptively with his feelings; pt continues to be encouraged to attend sober support groups online; 7/30/2020Pt reports continued sobriety, compliance with MAT; articulates feelings of boredom and occasional isolation related to the limitations imposed by the pandemic, expresses coping as best as I can; presents as coping adaptively, taking care of tasks that he had been putting off, staying connected with his children and Church; pt continues to be encouraged to resume Haematologist meetings to increase sober support network10/5/2020Pt reports continued sobriety; compliant with MAT;identifies boredom as a trigger; pt has been working through low mood and lethargy; stated he was sick last 2 weeks, COVID test was negative; states he is feeling better, enjoys out doors in person [employer]; identifies it as a main support system; continues to be encouraged to attend sober support groups online or in person12/2020Pt reports continued sobriety, compliant with MAT, reports occasional depressed mood due to limitations/restrictions caused by pandemic;reports feeling more hopeful when he thinks of how close we are t getting a vaccine states that it does not last for more than a couple of days at a time; identifies going to church in person and meeting up with his friends there as uplifting; does virtual bible study classes every Wednesdays, misses doing meals on wheels; states he stays busy getting things done around the house and feels good when he tackles  small tasks like clearing the clutter from his table; enjoys zoom calls with his daughters; pt continues to be encouraged to attend sober support groups online3/2021Pt reports continued sobriety, compliant with MAT, increasing discouraged by limitations due to COVID19- hopeful of getting ting vaccinated soon; working through Therapist, sports; feels supported and well taken care of by PCP; identifies his faith and church activities as his main coping skills; enjoys face time with his daughter and grandson in [place]; pt continues to be reluctant to engage in online/i person sober support groups5/25/2021Pt is sober, reports compliance with MAT; attends [facility] regularly; presents as more tangential, and more difficult to redirect; expresses increased anxiety around difficulty completing tasks; identifies procrastination as a huge challenge; discouraged in his marriage; feels close and supported by his daughter in [place] and his sister in [place]; client has been encouraged to attend sober support groups but it resistant to doing so; identifies Church as his main support system and his faith as his coping skill; Patient Stated Goal(s):  To stay sober and be happy and have a sense of purpose...per ptLong Term Goal(s):  Medication compliance/adjustments, Prevent recurrence/exacerbation of symptoms and Caregiver engagement in discharge plan/follow-up treatment     Target Date:  10/2021Short Term Goal/Objective:  Patient will report a decrease in substance use as evidenced by ? self report, -ve UDS and breathalyzers     Target Date: 03/2020     Status:  Achieved and ongoingShort Term Goal/Objective:  Patient will identify/practice/demonstrate the use of adaptive coping skills as evidenced by ? verbalizing and demonstrating emotional regulation and stress management skills     Target Date: 03/2020     Status:  Partially achievedShort Term Goal/Objective:  Patient will increase their sober support network as evidenced by ? attending online sober support groups 1-2 x weekly     Target Date: 03/2020     Status:  Minimal progressIntervention/Frequency:  Encourage identification/practice/mastery of adaptive coping skills weekly by phone/ zoom video due to COVID 19 precautionsIntervention/Frequency:  Educate patients on the benefits of joining a support group and utilizing support systems weekly by phone/zoom video due to COVID 19 precautionsIntervention/Frequency:  Encourage patient to Identify connection between feeling, thoughts and behaviors weekly by phone/zoom video due to COVID 19 precautionsOther Assessed Need(s) (Medical/Psychosocial)(List any other assessed needs that were identified in the assessment of the patient that are NOT included in the Interdisciplinary Problem List noted above.  Indicate what the INTERVENTION, REFERRAL or DEFERRAL will be for each of the other Assessed Needs identified (e.g. name of PCP, other medical provider, therapist, community resource, etc.)No other assessed need identifiedMedical / Psychological Assessments: Medications:Assessment of medication side effects and/or adverse medication reactions is ongoing     Current Outpatient Medications Medication Sig Note ? acetaminophen (TYLENOL) 325 MG tablet Take 650 mg by mouth every 6 (six) hours as needed..  ? buprenorphine-naloxone (SUBOXONE) 4-1 mg per sublingual film Place 1 Film under the tongue daily.  ? buPROPion XL (WELLBUTRIN XL) 300 mg 24 hr tablet Take 1 tablet (300 mg total) by mouth every morning.  ? caffeine 200 mg Tab tablet Take 200 mg by mouth daily.  ? calcium carbonate (TUMS) *200 mg CALCIUM* chewable tablet Take 3 tablets by mouth daily.  ? chlordiazePOXIDE (LIBRIUM) 25 MG capsule Take 25 mg by mouth 2 (two) times daily as needed for Anxiety.  ? clobetasol (TEMOVATE) 0.05 % cream APPLY SPARINGLY TO AFFECTED AREA TWICE A DAY  ? co-enzyme Q-10 30 mg capsule Take 30 mg by  mouth daily.  ? escitalopram oxalate (LEXAPRO) 10 mg tablet Take 1 tablet (10 mg total) by mouth every morning.  ? fish oil-omega-3 fatty acids 1,000 mg capsule Take 2 g by mouth daily.  ? furosemide (LASIX) 40 MG tablet TAKE 1 AND 1/2 TABLETS ORALLY DAILY AS DIRECTED 07/23/2016: Received from: External Pharmacy ? glucosamine-chondroitin 500-400 mg tablet Take 2 tablets by mouth daily.  ? MATZIM LA 360 mg LA 24 hr extended release tablet   ? mupirocin (BACTROBAN) 2 % ointment   ? tadalafil (CIALIS) 20 MG tablet Take 20 mg by mouth as needed for Erectile Dysfunction.  ? warfarin (COUMADIN) 6 MG tablet TAKE 1 TABLET BY MOUTH EVERY DAY  No current facility-administered medications for this encounter.  Consult / Referral Made:No Consults or Referrals made at this timeDischarge Planning: Anticipated Discharge Date: 3 monthsAnticipated Discharge Level of Care/ Disposition:Outpatient TherapyPlan: Pt will stay sober, compliant with MAT, attend [facility] weekly and increase sober support

## 2020-03-21 DIAGNOSIS — F1121 Opioid dependence, in remission: Secondary | ICD-10-CM

## 2020-03-23 ENCOUNTER — Inpatient Hospital Stay: Admit: 2020-03-23 | Discharge: 2020-03-23 | Payer: MEDICARE | Primary: Internal Medicine

## 2020-03-23 DIAGNOSIS — F1121 Opioid dependence, in remission: Secondary | ICD-10-CM

## 2020-03-23 NOTE — Group Note
Group Session:  Alcohol/Substance Abuse RecoveryGroup Start Time:    9:00 AM      End Time:  10:30 AMFacilitators:  Renato Gails, LADC This clinician is conducting this visit at a site within our enrolled clinical location.. For this visit the clinician and patient were present via interactive audio & video telecommunications system that permits real-time communications. This visit type was necessary for this patient in place of an in-person visit due to the COVID-19 outbreak. Patient counseled on available options for visit type; Patient elected video visit and was made aware they are able to opt out or refuse this service at any time; Patient consent given for video visit: Yes. The patient?s identity was verified by asking the patient to state their name and date of birth at the start of the encounter.State patient is located in: [place] clinician is appropriately licensed in the above state to provide care for this visit.Other individuals present during the telehealth encounter and their role/relation:other group members and Patrica Duel RNSession Focus Relapse preventionPts identified what they did to protect their recovery this week Number of Participants 6 Treatment Modality Group Therapy Interventions/Education Substance Abuse Recovery InterventionsAssisted client in acceptance of his/her chemical dependence in sessionDiscussed feelings of depression/low self esteem that underlie chemical useEncouraged processing of thoughts/feelings/actions related to substance abuseEncouraged patient to make positive decisionsEncouraged patient to pursue a healthy lifestyleIdentified connection between feelings, thoughts and behaviorsIdentified positive traits to build self esteemVerbally acknowledged/accepted dependence and need for help Attendance            AttendedEngagement InterventionsNot applicable - patient attended group without difficulty Degree of Participation Active Behavior Appropriate Speech/Thought Process Focused Affect/Mood Full range Insight Moderate Judgment Good Response to Interventions Attentive Individualization [place] - Suicide Severity Screen    Most Recent Value Have you wished you were dead or wished you could go to sleep and not wake up?  No Have you actually had any thoughts of killing yourself?   No Have you ever done anything, started to do anything, or prepared to do anything to end your life?   No [place] Suicide Risk Level  Low Risk   Plan Continue to assist patient with relapse prevention

## 2020-03-30 ENCOUNTER — Inpatient Hospital Stay: Admit: 2020-03-30 | Discharge: 2020-03-30 | Payer: MEDICARE | Primary: Internal Medicine

## 2020-03-30 NOTE — Group Note
Group Session:  Alcohol/Substance Abuse RecoveryGroup Start Time:    9:00 AM      End Time:  10:30 AMFacilitators:  Renato Gails, LADCThis clinician is conducting this visit at a site within our enrolled clinical location.. For this visit the clinician and patient were present via interactive audio & video telecommunications system that permits real-time communications. This visit type was necessary for this patient in place of an in-person visit due to the COVID-19 outbreak. Patient counseled on available options for visit type; Patient elected video visit and was made aware they are able to opt out or refuse this service at any time; Patient consent given for video visit: Yes. The patient?s identity was verified by asking the patient to state their name and date of birth at the start of the encounter.State patient is located in: [place] clinician is appropriately licensed in the above state to provide care for this visit.Other individuals present during the telehealth encounter and their role/relation: other group membersSession Focus Relapse preventionClients reviewed their week; identified negative experiences from their past that they are appreciative of because it helped their own personal growth and  recovery Number of Participants 6 Treatment Modality Group Therapy Interventions/Education Relapse Prevention InterventionsHelped patient become skilled in recognizing/process/coping with thoughts/feelingsIdentified alternative activities for social connectionIdentified alternative activities to structure leisure timeIdentified positive outcomes for refraining from substancesIdentified thoughts/feelings that lead to relapse Attendance            AttendedEngagement InterventionsNot applicable - patient attended group without difficulty Degree of Participation Moderate Behavior Appropriate Speech/Thought Process Focused Affect/Mood Full range Insight Moderate Judgment Moderate Response to Interventions Attentive Individualization [place] - Suicide Severity Screen    Most Recent Value Have you wished you were dead or wished you could go to sleep and not wake up?  No Have you actually had any thoughts of killing yourself?   No Have you ever done anything, started to do anything, or prepared to do anything to end your life?   No [place] Suicide Risk Level  Low Risk  pt reports staying sober' compliant with MAT; states he has been pacing himself and doing just 1-2 tasks a day; looking forward to his daughter coming up from [place] to attend a wedding; had planned to drive her home from the airport but realized that she was hoping t be driven to the [place] which is much farther for him; states that he is willing to [facility] it because he does not like to say not his daughter; looking at the bright side that he, his wife and daughter will get time to talk on their way up there and back Plan Continue to assist patient with relapse prevention

## 2020-04-06 ENCOUNTER — Inpatient Hospital Stay: Admit: 2020-04-06 | Discharge: 2020-04-06 | Payer: MEDICARE | Primary: Internal Medicine

## 2020-04-06 DIAGNOSIS — F1121 Opioid dependence, in remission: Secondary | ICD-10-CM

## 2020-04-06 NOTE — Group Note
Group Session:  Alcohol/Substance Abuse RecoveryGroup Start Time:    9:00 AM      End Time:  10:30 AMFacilitators:  Renato Gails, LADC  This clinician is conducting this visit at a site within our enrolled clinical location.. For this visit the clinician and patient were present via interactive audio & video telecommunications system that permits real-time communications. This visit type was necessary for this patient in place of an in-person visit due to the COVID-19 outbreak. Patient counseled on available options for visit type; Patient elected video visit and was made aware they are able to opt out or refuse this service at any time; Patient consent given for video visit: Yes. The patient?s identity was verified by asking the patient to state their name and date of birth at the start of the encounter.State patient is located in: [place] clinician is appropriately licensed in the above state to provide care for this visit.Other individuals present during the telehealth encounter and their role/relation:other group members and Junious Dresser RNSession Focus Relapse preventionStages of changePts reviewed their week, identified successes and stressors and coping skills used to stay sober Number of Participants 6 Treatment Modality Group Therapy Interventions/Education Relapse Prevention InterventionsEducated clients of the benefits of joining a support groupHelped patient become skilled in recognizing/process/coping with thoughts/feelingsIdentified alternative activities for social connectionIdentified alternative activities to structure leisure timeIdentified positive outcomes for refraining from substancesStages of Change InterventionsProcessed stages  of change in groupUnderstand the stages of change Attendance            AttendedEngagement InterventionsNot applicable - patient attended group without difficulty Degree of Participation Active Behavior Appropriate Speech/Thought Process Focused Affect/Mood Full range Insight Moderate Judgment Good Response to Interventions Attentive Individualization Pt reports continued sobriety, compliant with MAT, happy that he got to see his daughter over the weekend; connected with his other daughter in [place]; feels that talking to her is stressful because everything is different in her world; states that he has some health issues but trying to stay positive and hopeful; staying busy helps him not dwell on the negative; has decided he wants to go to bed earlier and get up earlier so that he can make use of the extra hours of sunshine-will try it out this week[place] - Suicide Severity Screen    Most Recent Value Have you wished you were dead or wished you could go to sleep and not wake up?  No Have you actually had any thoughts of killing yourself?   No Have you ever done anything, started to do anything, or prepared to do anything to end your life?   No [place] Suicide Risk Level  Low Risk   Plan Continue to assist patient with relapse prevention

## 2020-04-12 ENCOUNTER — Inpatient Hospital Stay: Admit: 2020-04-12 | Discharge: 2020-04-12 | Payer: MEDICARE | Primary: Internal Medicine

## 2020-04-12 ENCOUNTER — Encounter: Admit: 2020-04-12 | Payer: PRIVATE HEALTH INSURANCE | Attending: Psychiatry | Primary: Internal Medicine

## 2020-04-12 DIAGNOSIS — F1121 Opioid dependence, in remission: Secondary | ICD-10-CM

## 2020-04-12 LAB — URINE DRUG SCREEN, MEDTOX     (GH)
BKR AMPHETAMINE: NEGATIVE
BKR BARBITURATES: NEGATIVE
BKR BENZODIAZEPINES: POSITIVE — AB
BKR BUPRENORPHINE: POSITIVE — AB
BKR COCAINE (BENZOYLECGONINE): NEGATIVE
BKR METHADONE: NEGATIVE
BKR METHAMPHETAMINE: NEGATIVE
BKR OPIATES: NEGATIVE
BKR OXYCODONE: NEGATIVE
BKR PCP, PHENCYCLIDINE: NEGATIVE
BKR PROPOXYPHENE: NEGATIVE
BKR TCA, TRICYCLIC ANTIDEPRESSANTS: NEGATIVE
BKR THC, CANNABINOIDS: NEGATIVE

## 2020-04-12 LAB — ETHANOL, URINE, RANDOM     (BH GH LMW): BKR ETHANOL URINE: NEGATIVE

## 2020-04-12 MED ORDER — BUPRENORPHINE 4 MG-NALOXONE 1 MG SUBLINGUAL FILM
4-1 mg | ORAL_FILM | Freq: Every day | SUBLINGUAL | 1 refills | Status: AC
Start: 2020-04-12 — End: 2020-05-10

## 2020-04-12 MED ORDER — ESCITALOPRAM 10 MG TABLET
10 mg | ORAL_TABLET | Freq: Every morning | ORAL | 1 refills | Status: AC
Start: 2020-04-12 — End: 2020-07-06

## 2020-04-12 MED ORDER — BUPROPION HCL XL 300 MG 24 HR TABLET, EXTENDED RELEASE
300 mg | ORAL_TABLET | Freq: Every morning | ORAL | 1 refills | Status: AC
Start: 2020-04-12 — End: 2020-05-04

## 2020-04-12 NOTE — Progress Notes
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 Has been adhering with Suboxone 4-1, taking it over the day 4 times a day. Not misusing meds or using alcohol or other drugs.  Says he has been struggling at times with the ostomy bag and the TPN and sometimes gets a dumping syndrome. Says he has dealt with the Crohn's for years and is used to it. He does note that he has pain in his abdomen along with chronic joint pains in his hands, arms, shoulder and legs. The Suboxone helps a bit. Is seeing his general doctor and specialists regularly. Pt is staying hopeful despite the chronic medical issues and is grateful for the AR[facility]. Not feeling sad or hopeless or suicidal. Sleep is fine. Has adhered with his antidepressants which have helped. No other concerns or issues.REVIEW OF ALLERGIES/MEDICATIONS/HISTORY I have reviewed the patient's current medications, allergies and past medical historyI have reviewed with the patient, the risks and benefits of the medications prescribed.OBJECTIVE Review of SystemsReview of Systems Constitutional: Positive for appetite change and fatigue. Negative for activity change. Respiratory: Negative for cough.  Gastrointestinal: Positive for abdominal pain and diarrhea. Musculoskeletal: Positive for arthralgias and neck pain. Psychiatric/Behavioral: Negative for confusion, decreased concentration, dysphoric mood, sleep disturbance and suicidal ideas. The patient is not nervous/anxious.   LabsNo new labsCurrent MedicationsCurrent Outpatient Medications Medication Sig Note ? acetaminophen (TYLENOL) 325 MG tablet Take 650 mg by mouth every 6 (six) hours as needed..  ? buprenorphine-naloxone (SUBOXONE) 4-1 mg per sublingual film Place 1 Film under the tongue daily.  ? buPROPion XL (WELLBUTRIN XL) 300 mg 24 hr tablet Take 1 tablet (300 mg total) by mouth every morning.  ? buPROPion XL (WELLBUTRIN XL) 300 mg 24 hr tablet Take 1 tablet (300 mg total) by mouth every morning.  ? caffeine 200 mg Tab tablet Take 200 mg by mouth daily.  ? calcium carbonate (TUMS) *200 mg CALCIUM* chewable tablet Take 3 tablets by mouth daily.  ? chlordiazePOXIDE (LIBRIUM) 25 MG capsule Take 25 mg by mouth 2 (two) times daily as needed for Anxiety.  ? clobetasol (TEMOVATE) 0.05 % cream APPLY SPARINGLY TO AFFECTED AREA TWICE A DAY  ? co-enzyme Q-10 30 mg capsule Take 30 mg by mouth daily.  ? escitalopram oxalate (LEXAPRO) 10 mg tablet Take 1 tablet (10 mg total) by mouth every morning.  ? fish oil-omega-3 fatty acids 1,000 mg capsule Take 2 g by mouth daily.  ? furosemide (LASIX) 40 MG tablet TAKE 1 AND 1/2 TABLETS ORALLY DAILY AS DIRECTED 07/23/2016: Received from: External Pharmacy ? glucosamine-chondroitin 500-400 mg tablet Take 2 tablets by mouth daily.  ? MATZIM LA 360 mg LA 24 hr extended release tablet   ? mupirocin (BACTROBAN) 2 % ointment   ? tadalafil (CIALIS) 20 MG tablet Take 20 mg by mouth as needed for Erectile Dysfunction.  ? warfarin (COUMADIN) 6 MG tablet TAKE 1 TABLET BY MOUTH EVERY DAY  No current facility-administered medications for this encounter.  Mental Status ExamGeneral AppearanceHabitus:  MediumGrooming:  GoodMusculoskeletalGait  and Station: Stable gaitPsychiatricAttitude: Cooperative and pleasantPsychomotor Behavior: No psychomotor activation or retardationSpeech: normal rate, volume and prosodyMood: Normal not depressed, not anxious, not discouraged   Patient reported mood:  Not bad...just more painAffect: Congruent to reported mood, full range and appropriately reactiveThought Process: Coherent, logical, goal directedAssociations: NormalThought Content: NormalSuicidal Ideation: No current suicidal plan, ideation or intentHomicidal Ideation: No current homicidal ideation, plan or intentJudgment:  GoodInsight:  GoodCognitive EvaluationOrientation: Oriented to person, oriented to place and oriented to date/time Attention and Concentration:  Normal attention and concentrationMemory: Recent and remote memory intactLanguage:  Language intactFund of Knowledge:  NormalSafety and Risk AssessmentPSY RISK ASSESSMENT SAFE-T WITH C-SSRS  Reason for Assessment:  Routine Ambulatory Psychiatric AssessmentC-SSRS: Suicidal Ideation:  Since Last Visit  WISH TO BE DEAD:  No  CURRENT SUICIDAL THOUGHTS:  No  Have you ever done anything, started to do anything or prepared to do anything to end your life?:  NoRisk Assessment:   Access to Lethal Methods:  NoCurrent and Past Psychiatric Diagnoses:    Mood Disorder:  Recurrent/Current  Psychotic Disorder:  No  Alcohol/Substance Abuse Disorders:  Sustained Remission  PTSD:  No  ADHD:  Defer for Further Assessment  TBI:  No  Cluster B Personality Disorders or Traits:  No  Conduct Problems:  No  Suicide Attempt:  No Prior Attempts  Presenting Symptoms:  Anhedonia:  No  Impulsivity:  No  Hopelessness or Despair:  No  Anxiety and/or Panic:  No  Insomnia:  No  Command Hallucinations:  No  Psychosis:  No  Self-injurious Behavior:  No  Physical Aggression Toward Others:  No  Violence, Victimization and/or Perpetration:  No Family History:   Suicide:  No  Suicidal Behavior:  No  Axis I Psychiatric Diagnosis requiring hospitalization:  No  Precipitants / Stressors:   Triggering events leading to humiliation, shame and/or despair:  No  Chronic physical pain or other acute medical problem:  Yes  Sexual / Physical Abuse:  No  Substance Intoxication or Withdrawal:  No  Pending Incarceration:  No  Legal Problems:  No  Inadequate Social Supports:  No  Social Isolation:  No  Perceived burden on others:  No  Bullying / Cyberbullying:  No  Media portrayal of suicide:  No  Housing Insecurity:  No  Financial Insecurity:  No  Stressful Life Events:  No  Gender / Sexual Identity:  No  Homelessness:  No  Change in Treatment:    Recent inpatient discharge:  No  Change in provider or treatment:  No  Hopeless or dissatisfied with provider or treatment:  No  Non-compliant:  No  Not receiving treatment:  NoCurrent and Past Psychiatric Diagnoses:  Mood Disorder:  Recurrent/Current  Psychotic Disorder:  No  Alcohol/Substance Abuse Disorders:  Sustained Remission  PTSD:  No  ADHD:  Defer for Further Assessment  TBI:  No  Cluster B Personality Disorders or Traits:  No  Conduct Problems:  No  Suicide Attempt:  No Prior AttemptsProtective Factors:   Internal:   Ability to cope with stress:  Yes  Frustration tolerance:  Yes  Religious beliefs:  Yes  Fear of death or the actual act of killing self:  Yes  Identifies reasons for living:  Yes  Problem solving skills:  Yes  External:   Cultural, spiritual and/or moral attitudes against suicide:  Yes  Responsibility to children:  Yes  Beloved pets:  No  Supportive social network of friends or family:  Yes  Positive therapeutic relationships / Effective mental  healthcare:  Yes  Step 2 External - Engaged in work/school: n/a.Risk to Self - Self-Injurious Behavior:   Current Urges to harm Self:  No  Recent Self-Injury:  No  History of Self Injury:  No  Attitude Regarding Self Injury:  Ego dystonic  Imminent Risk for Self Injury in Community:  Low  Imminent Risk for Self Injury in Facility:  LowRisk to Others:   Current Agitation:  No  Homicidal/Aggressive Ideation:  No  Homicidal/Aggressive Threat/Plan:  No  Recent Violence/Aggression:  No  Attitude Regarding Aggression / Violence:  Ego dystonic  Imminent Risk for Violence in Community:  Low  Imminent Risk for Violence in Facility:  LowWithdrawal Risk:   Alcohol / Benzodiazepines / Barbiturates:  Daily use (prescribed librium)     Alcohol/Benzodiazepine/Barbiturate Risk for Withdrawal:  Low  Opioids:  Daily use (prescribed suboxone)     Opioid Risk for Withdrawal:  LowMEDICAL DECISION MAKING DiagnosisProblem List         ICD-10-CM   Y IOP/PHP THERAPY PLAN  Opioid use disorder, moderate, in sustained remission, on maintenance therapy, dependence (HC Code) F11.21  Major depressive Disorder, 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 Patient doing well overall.  Mood has been euthymic, but energy level is low. Maintaining on current dose of buprenorphine 4-1 mg a day.The Self-Report [place] Risk Suicide Severity Rating Scale Since Last Visit was completed by Ramon Dredge and reviewed by myself today.  Additionally, risk assessment from Estanislado Pandy Saephanh's intake assessment and follow up screenings have been reviewed.  Based on Daniel Ritthaler Rubin?s clinical presentation today as well as his self-reported symptom ratings today and over the current course of treatment, I have assessed Ramon Dredge to be at unchanged risk of harm to self as compared with prior recent assessments.  Based on this assessment, I have continued current treatment plan including implementation of factors to mitigate risks of harm to self as outlined in the last risk assessmentIntervention(s)Medication managementLabs/Test/Consults OrderedToxicologyMedication ChangesnonePlan-continue prescription of buprenorphine-naloxone 4-1mg  daily for opioid maintenance-continue escitalopram 10mg  daily for depression-continue Wellbutrin XL 300mg  daily for depression-continue CCG; group conducted by video due to COVID-Return to clinic in 8 weeks, call earlier as needed for medication refill etc.Electronically Signed6/22/2021

## 2020-04-13 ENCOUNTER — Inpatient Hospital Stay: Admit: 2020-04-13 | Discharge: 2020-04-13 | Payer: MEDICARE | Primary: Internal Medicine

## 2020-04-13 DIAGNOSIS — F1121 Opioid dependence, in remission: Secondary | ICD-10-CM

## 2020-04-13 NOTE — Group Note
Group Session:  Alcohol/Substance Abuse RecoveryGroup Start Time:    9:00 AM      End Time:  10:30 AMFacilitators:  Renato Gails, LADCThis clinician is conducting this visit at a site within our enrolled clinical location.. For this visit the clinician and patient were present via interactive audio & video telecommunications system that permits real-time communications. This visit type was necessary for this patient in place of an in-person visit due to the COVID-19 outbreak. Patient counseled on available options for visit type; Patient elected video visit and was made aware they are able to opt out or refuse this service at any time; Patient consent given for video visit: yes The patient?s identity was verified by asking the patient to state their name and date of birth at the start of the encounter.State patient is located in: [place]The clinician is appropriately licensed in the above state to provide care for this visit.Other individuals present during the telehealth encounter and their role/relation:other group members; Patrica Duel RNSession Focus Relapse preventionPts reviewed highlights and challenges of their weekGroup dicussed creating something as a way of managing plateaus or feeing stuck Number of Participants 6 Treatment Modality Group Therapy Interventions/Education Relapse Prevention InterventionsHelped patient become skilled in recognizing/process/coping with thoughts/feelingsHelped patient identify relapse prevention strategiesIdentified alternative activities for social connectionIdentified alternative activities to structure leisure timeIdentified positive outcomes for refraining from substances Attendance            AttendedEngagement InterventionsNot applicable - patient attended group without difficulty Degree of Participation Active Behavior Appropriate Speech/Thought Process Focused Affect/Mood Full range Insight Moderate Judgment Moderate Response to Interventions Attentive Individualization [place] - Suicide Severity Screen    Most Recent Value Have you wished you were dead or wished you could go to sleep and not wake up?  No Have you actually had any thoughts of killing yourself?   No Have you ever done anything, started to do anything, or prepared to do anything to end your life?   No [place] Suicide Risk Level  Low Risk  pt staying sober, compliant with MAT; had first visit with Dr Sharyon Cable yesterday; reports stable mood; finds it challenging to motivate himself to get things done; likes going to [facility] and turning things over Plan Continue to assist patient with relapse prevention

## 2020-04-20 ENCOUNTER — Inpatient Hospital Stay: Admit: 2020-04-20 | Discharge: 2020-04-20 | Payer: MEDICARE | Primary: Internal Medicine

## 2020-04-20 DIAGNOSIS — F1121 Opioid dependence, in remission: Secondary | ICD-10-CM

## 2020-04-20 NOTE — Group Note
Group Session:  Alcohol/Substance Abuse RecoveryGroup Start Time:    9:00 AM      End Time:  10:30 AMFacilitators:  Renato Gails, LADC This clinician is conducting this visit at a site within our enrolled clinical location.. For this visit the clinician and patient were present via interactive audio & video telecommunications system that permits real-time communications. This visit type was necessary for this patient in place of an in-person visit due to the COVID-19 outbreak. Patient counseled on available options for visit type; Patient elected video visit and was made aware they are able to opt out or refuse this service at any time; Patient consent given for video visit: Yes. The patient?s identity was verified by asking the patient to state their name and date of birth at the start of the encounter.State patient is located in: [place]The clinician is appropriately licensed in the above state to provide care for this visit.Other individuals present during the telehealth encounter and their role/relation: Nurse Patrica Duel RN, other group membersSession Focus Stages of changeStages of recovery discussed; pt's identified the stage they are at; wed discussed ways to add meaningfulness to recovery; concept of [place] discussed; pts identified activities that have helped add meaningfulness and purposefulness to their recovery Number of Participants 3 Treatment Modality Group Therapy Interventions/Education Relapse Prevention InterventionsHelped patient become skilled in recognizing/process/coping with thoughts/feelingsHelped patient draft a relapse prevention planIdentified alternative activities to structure leisure timeIdentified positive outcomes for refraining from substancesStages of Change InterventionsIdentified and described the clients Stage of ChangeIdentified current life stages of changeProcessed stages  of change in group Attendance AttendedEngagement InterventionsNot applicable - patient attended group without difficulty Degree of Participation Active Behavior Appropriate Speech/Thought Process Focused Affect/Mood Full range Insight Moderate Judgment Good Response to Interventions Attentive Individualization Pt shared that he had a busy week; 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  No Have you actually had any thoughts of killing yourself?   No Have you ever done anything, started to do anything, or prepared to do anything to end your life?   No [place] Suicide Risk Level  Low Risk    Plan Continue to assist patient with relapse prevention

## 2020-04-27 ENCOUNTER — Inpatient Hospital Stay: Admit: 2020-04-27 | Discharge: 2020-04-27 | Payer: MEDICARE | Primary: Internal Medicine

## 2020-04-27 DIAGNOSIS — F1121 Opioid dependence, in remission: Secondary | ICD-10-CM

## 2020-04-27 NOTE — Group Note
Group Session:  Alcohol/Substance Abuse RecoveryGroup Start Time:    9:00 AM      End Time:  10:30 AMFacilitators:  Renato Gails, LADCThis clinician is conducting this visit at a site within our enrolled clinical location.. For this visit the clinician and patient were present via interactive audio & video telecommunications system that permits real-time communications. This visit type was necessary for this patient in place of an in-person visit due to the COVID-19 outbreak. Patient counseled on available options for visit type; Patient elected video visit and was made aware they are able to opt out or refuse this service at any time; Patient consent given for video visit: Yes. The patient?s identity was verified by asking the patient to state their name and date of birth at the start of the encounter.State patient is located in: [place]The clinician is appropriately licensed in the above state to provide care for this visit.Other individuals present during the telehealth encounter and their role/relation: none and other group members and nurse Junious Dresser F RNSession Focus Relapse preventionPts checked in about their weeks; identified coping skills used to manage challenges and got recovery oriented feedback from peersGroup discussed music, pets, and plants as helpful in maintaining a positive outlook  Number of Participants 3 Treatment Modality Group Therapy Interventions/Education Relapse Prevention InterventionsEducated clients of the benefits of joining a support groupHelped patient become skilled in recognizing/process/coping with thoughts/feelingsIdentified alternative activities for social connectionIdentified alternative activities to structure leisure timeIdentified positive outcomes for refraining from substances Attendance            AttendedEngagement InterventionsNot applicable - patient attended group without difficulty Degree of Participation Active Behavior Appropriate Speech/Thought Process Focused Affect/Mood Full range Insight Moderate Judgment Good Response to Interventions Attentive Individualization Pt reports staying compliant with meds and staying sober; worried about other group members who are absent due to illness; working through concern for his wife's family in [place] who do not have/do not avail of the COVID vaccine; enjoys playing his harmonic for his grandson in their daily face time routine; states he has to space out activities during the day so as not to get overtired and run out of energy; involvement with church helps him feels connected and 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  No Have you actually had any thoughts of killing yourself?   No Have you ever done anything, started to do anything, or prepared to do anything to end your life?   No [place] Suicide Risk Level  Low Risk   Plan Continue to assist patient with relapse prevention

## 2020-05-04 ENCOUNTER — Telehealth: Admit: 2020-05-04 | Payer: PRIVATE HEALTH INSURANCE | Attending: Psychiatry | Primary: Internal Medicine

## 2020-05-04 ENCOUNTER — Encounter: Admit: 2020-05-04 | Payer: PRIVATE HEALTH INSURANCE | Attending: Psychiatry | Primary: Internal Medicine

## 2020-05-04 ENCOUNTER — Inpatient Hospital Stay: Admit: 2020-05-04 | Discharge: 2020-05-04 | Payer: MEDICARE | Primary: Internal Medicine

## 2020-05-04 MED ORDER — BUPROPION HCL XL 300 MG 24 HR TABLET, EXTENDED RELEASE
300 mg | ORAL_TABLET | Freq: Every morning | ORAL | 1 refills | Status: AC
Start: 2020-05-04 — End: 2020-05-27

## 2020-05-04 MED ORDER — BUPROPION HCL XL 300 MG 24 HR TABLET, EXTENDED RELEASE
300 mg | ORAL_TABLET | Freq: Every morning | ORAL | 1 refills | Status: AC
Start: 2020-05-04 — End: 2020-05-04

## 2020-05-04 NOTE — Group Note
Group Session:  Alcohol/Substance Abuse RecoveryGroup Start Time:    9:00 AM      End Time:  10:30 AMFacilitators:  Renato Gails, LADC This clinician is conducting this visit at a site within our enrolled clinical location.. For this visit the clinician and patient were present via interactive audio & video telecommunications system that permits real-time communications. This visit type was necessary for this patient in place of an in-person visit due to the COVID-19 outbreak. Patient counseled on available options for visit type; Patient elected video visit and was made aware they are able to opt out or refuse this service at any time; Patient consent given for video visit: Yes. The patient?s identity was verified by asking the patient to state their name and date of birth at the start of the encounter.State patient is located in: [place]The clinician is appropriately licensed in the above state to provide care for this visit.Other individuals present during the telehealth encounter and their role/relation: other group members and nurse Myra Gianotti RNSession Focus Relapse preventionPts identified success and stressors from the past week and shared the coping skills used to stay sober Number of Participants 5 Treatment Modality Group Therapy Interventions/Education Relapse Prevention InterventionsHelped patient become skilled in recognizing/process/coping with thoughts/feelingsHelped patient increase community based supportsIdentified alternative activities for social connectionIdentified positive outcomes for refraining from substances Attendance            AttendedEngagement InterventionsNot applicable - patient attended group without difficulty Degree of Participation Active Behavior Appropriate Speech/Thought Process Focused Affect/Mood Full range Insight Fair Judgment Good Response to Interventions Attentive Individualization [place] - Suicide Severity Screen    Most Recent Value Have you wished you were dead or wished you could go to sleep and not wake up?  No Have you actually had any thoughts of killing yourself?   No Have you ever done anything, started to do anything, or prepared to do anything to end your life?   No [place] Suicide Risk Level  Low Risk  Pt shared that the highlight of his week was going to the beach yesterday with his wife and face timing with his grandson; supportive of peers; denies triggers for relapse Plan Continue to assist patient with relapse prevention

## 2020-05-10 ENCOUNTER — Encounter: Admit: 2020-05-10 | Payer: PRIVATE HEALTH INSURANCE | Attending: Psychiatry | Primary: Internal Medicine

## 2020-05-10 MED ORDER — BUPRENORPHINE 4 MG-NALOXONE 1 MG SUBLINGUAL FILM
4-1 mg | ORAL_FILM | Freq: Every day | SUBLINGUAL | 1 refills | Status: AC
Start: 2020-05-10 — End: 2020-05-12

## 2020-05-11 ENCOUNTER — Inpatient Hospital Stay: Admit: 2020-05-11 | Discharge: 2020-05-11 | Payer: MEDICARE | Primary: Internal Medicine

## 2020-05-11 ENCOUNTER — Telehealth: Admit: 2020-05-11 | Payer: PRIVATE HEALTH INSURANCE | Attending: Psychiatry | Primary: Internal Medicine

## 2020-05-11 NOTE — Telephone Encounter
[facility] pharmacy is out of Suboxone.  Patient needs to pick up meds at [facility] [address]. Tyler Khan, Tyler Khan .[phone] he needs a new RX in Epic.

## 2020-05-11 NOTE — Group Note
Group Session:  Alcohol/Substance Abuse RecoveryGroup Start Time:    9:00 AM      End Time:  10:30 AMFacilitators:  Renato Gails, LADC This clinician is conducting this visit at a site within our enrolled clinical location.. For this visit the clinician and patient were present via interactive audio & video telecommunications system that permits real-time communications. This visit type was necessary for this patient in place of an in-person visit due to the COVID-19 outbreak. Patient counseled on available options for visit type; Patient elected video visit and was made aware they are able to opt out or refuse this service at any time; Patient consent given for video visit: Yes. The patient?s identity was verified by asking the patient to state their name and date of birth at the start of the encounter.State patient is located in: [place]The clinician is appropriately licensed in the above state to provide care for this visit.Other individuals present during the telehealth encounter and their role/relation:other group members and nurse Junious Dresser RNSession Focus Relapse preventionPts gave recovery oriented feedback; discussed the cycle of abuse and how to interrupt it; reviewed the benefits of increasing sober support Number of Participants 5 Treatment Modality Group Therapy Interventions/Education Relapse Prevention InterventionsHelped patient become skilled in recognizing/process/coping with thoughts/feelingsIdentified alternative activities for social connectionIdentified positive outcomes for refraining from substancesIdentified thoughts/feelings that lead to relapse Attendance            AttendedEngagement InterventionsNot applicable - patient attended group without difficulty Degree of Participation Moderate Behavior Appropriate Speech/Thought Process Focused Affect/Mood Full range Insight Moderate Judgment Good Response to Interventions Attentive Individualization [place] - Suicide Severity Screen    Most Recent Value Have you wished you were dead or wished you could go to sleep and not wake up?  No Have you actually had any thoughts of killing yourself?   No Have you ever done anything, started to do anything, or prepared to do anything to end your life?   No [place] Suicide Risk Level  Low Risk   Plan Continue to assist patient with relapse prevention

## 2020-05-12 ENCOUNTER — Encounter: Admit: 2020-05-12 | Payer: PRIVATE HEALTH INSURANCE | Attending: Psychiatry | Primary: Internal Medicine

## 2020-05-12 MED ORDER — BUPRENORPHINE 4 MG-NALOXONE 1 MG SUBLINGUAL FILM
4-1 mg | ORAL_FILM | Freq: Every day | SUBLINGUAL | 1 refills | Status: AC
Start: 2020-05-12 — End: 2020-06-17

## 2020-05-18 ENCOUNTER — Inpatient Hospital Stay: Admit: 2020-05-18 | Discharge: 2020-05-18 | Payer: MEDICARE | Primary: Internal Medicine

## 2020-05-18 DIAGNOSIS — F1121 Opioid dependence, in remission: Secondary | ICD-10-CM

## 2020-05-18 NOTE — Group Note
Group Session:  Alcohol/Substance Abuse RecoveryGroup Start Time:    9:00 AM      End Time:  10:30 AMFacilitators:  Renato Gails, LADC This clinician is conducting this visit at a site within our enrolled clinical location.. For this visit the clinician and patient were present via interactive audio & video telecommunications system that permits real-time communications. This visit type was necessary for this patient in place of an in-person visit due to the COVID-19 outbreak. Patient counseled on available options for visit type; Patient elected video visit and was made aware they are able to opt out or refuse this service at any time; Patient consent given for video visit: Yes. The patient?s identity was verified by asking the patient to state their name and date of birth at the start of the encounter.State patient is located in: [place] clinician is appropriately licensed in the above state to provide care for this visit.Other individuals present during the telehealth encounter and their role/relation: none and other group members and nurse Junious Dresser F RNSession Focus Relapse prevention Number of Participants 5 Treatment Modality Group Therapy Interventions/Education Relapse Prevention InterventionsEncouraged patient to pursue a healthy lifestyleHelped patient become skilled in recognizing/process/coping with thoughts/feelingsHelped patient draft a relapse prevention planHelped patient identify relapse prevention strategiesIdentified alternative activities for social connectionIdentified alternative activities to structure leisure timeIdentified positive outcomes for refraining from substancesIdentified thoughts/feelings that lead to relapse Attendance            AttendedEngagement InterventionsNot applicable - patient attended group without difficulty Degree of Participation Active Behavior Appropriate Speech/Thought Process Focused Affect/Mood Full range Insight Moderate Judgment Good Response to Interventions Attentive Individualization [place] - Suicide Severity Screen    Most Recent Value Have you wished you were dead or wished you could go to sleep and not wake up?  No Have you actually had any thoughts of killing yourself?   No Have you ever done anything, started to do anything, or prepared to do anything to end your life?   No [place] Suicide Risk Level  Low Risk  Pt supportive of peers, reports MAT compliance; denies stressors or triggers Plan Continue to assist patient with relapse prevention

## 2020-05-21 DIAGNOSIS — F3340 Major depressive disorder, recurrent, in remission, unspecified: Secondary | ICD-10-CM

## 2020-05-21 DIAGNOSIS — F1121 Opioid dependence, in remission: Secondary | ICD-10-CM

## 2020-05-25 ENCOUNTER — Inpatient Hospital Stay: Admit: 2020-05-25 | Discharge: 2020-05-25 | Payer: MEDICARE | Primary: Internal Medicine

## 2020-05-25 DIAGNOSIS — F1121 Opioid dependence, in remission: Secondary | ICD-10-CM

## 2020-05-25 NOTE — Group Note
Group Session:  Alcohol/Substance Abuse RecoveryGroup Start Time:    9:00 AM      End Time:  10:30 AMFacilitators:  Renato Gails, LADC This clinician is conducting this visit at a site within our enrolled clinical location.. For this visit the clinician and patient were present via interactive audio & video telecommunications system that permits real-time communications. This visit type was necessary for this patient in place of an in-person visit due to the COVID-19 outbreak. Patient counseled on available options for visit type; Patient elected video visit and was made aware they are able to opt out or refuse this service at any time; Patient consent given for video visit: Yes. The patient?s identity was verified by asking the patient to state their name and date of birth at the start of the encounter.State patient is located in: [place]The clinician is appropriately licensed in the above state to provide care for this visit.Other individuals present during the telehealth encounter and their role/relation: noneSession Focus Relapse preventionGroup members offered support and feedback to peer who self reported relapsing on alcohol on 7/302021 Identifying and interrupting relapse drift discussed Number of Participants 6 Treatment Modality Group Therapy Interventions/Education Relapse Prevention InterventionsEducated clients of the benefits of joining a support groupHelped patient become skilled in recognizing/process/coping with thoughts/feelingsHelped patient increase self awareness/cylce of relapseHelped patient increase awareness of support groupsIdentified and deactivated triggers to drug/alcohol useIdentified alternative activities for social connectionIdentified positive outcomes for refraining from substancesIdentified thoughts/feelings that lead to relapse Attendance            AttendedEngagement InterventionsNot applicable - patient attended group without difficulty Degree of Participation Moderate Behavior Appropriate Speech/Thought Process Focused Affect/Mood Full range Insight Moderate Judgment Good Response to Interventions Attentive Individualization [place] - Suicide Severity Screen    Most Recent Value Have you wished you were dead or wished you could go to sleep and not wake up?  No Have you actually had any thoughts of killing yourself?   No Have you ever done anything, started to do anything, or prepared to do anything to end your life?   No [place] Suicide Risk Level  Low Risk  pt supportive of peers; states that he has had low energy this week; stayed connected with his daughter and his church fellowship both of which help him stay positive and future oriented Plan Continue to assist patient with relapse prevention

## 2020-05-26 ENCOUNTER — Encounter: Admit: 2020-05-26 | Payer: PRIVATE HEALTH INSURANCE | Attending: Psychiatry | Primary: Internal Medicine

## 2020-05-27 MED ORDER — BUPROPION HCL XL 300 MG 24 HR TABLET, EXTENDED RELEASE
300 mg | ORAL_TABLET | Freq: Every morning | ORAL | 1 refills | Status: AC
Start: 2020-05-27 — End: 2020-07-06

## 2020-05-30 ENCOUNTER — Encounter: Admit: 2020-05-30 | Payer: PRIVATE HEALTH INSURANCE | Primary: Internal Medicine

## 2020-05-30 NOTE — Other
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   INFUSION TREATMENT     Level of Care/Treatment Track: ARC CCGFrequency: Once a weekEstimated duration/length of stay in the program: 3 monthsTreatment Modalities: Group PsychotherapyPatient and/or Family Stated Short Term Personal Goal(s): I want to be content and grateful for what I have...per ptPatient and/or Family Stated Long Term Personal Goal(s): I want to rely on good coping skills to manage my pain and discomfort...per ptGoal of Treatment #1: Relapse preventionGoal of Treatment #2: medication assisted treatmentGoal of Treatment #3: increase sober supportThe patient did participate in the development of this Treatment Plan.Active Multidisciplinary Problems: INTERDISCIPLINARY PROBLEM LISTSubstance Use / Abuse:  ActiveDescriptive Behaviors:  Pt has hx of opiate dependence and has been on MAT at [facility] since 2014; Pt rpeorts compliance with MAT and keeps scheduled appts with dr Helmut Muster Pt's UDS +ve for alcohol on 10/29/2018. Pt stated that he had drank alcohol over the holidays out of boredom ad sense of feeling deprived because of several limitations related to Crohn's disease. Pt understands the risks of continued alcohol use including cross addiction; states intent to not drink alcoholPt identifes loneliness and lack of fulfillment in his marriage as stressorsPt does Meals on Wheels, is ery involved with his church and has a supportive relationship with his daughter in [place] who recently has her first child.Pt's wife is going for family reunion [place] pt will visit his daughter during that timePt used to attend [employer]; has not gone recently; pt continues to be encouraged to resume sober support groups5/4/2020Pt reports staying sober, denies cravings or thoughts of drinking alcohol,compliant with MAT, verbalizes frustration over being essentially confined to his house due to COVID 19 public health guidelines; identifies boredom as stressor; stays connected with [employer] and cingregation through [employer]; also facetimes regularly with his daughter in [place]; disappointed tat he had to cancel his trip to visit her and his grandson.Pt coping adaptively with his feelings; pt continues to be encouraged to attend sober support groups online; 7/30/2020Pt reports continued sobriety, compliance with MAT; articulates feelings of boredom and occasional isolation related to the limitations imposed by the pandemic, expresses coping as best as I can; presents as coping adaptively, taking care of tasks that he had been putting off, staying connected with his children and Church; pt continues to be encouraged to resume Haematologist meetings to increase sober support network10/5/2020Pt reports continued sobriety; compliant with MAT;identifies boredom as a trigger; pt has been working through low mood and lethargy; stated he was sick last 2 weeks, COVID test was negative; states he is feeling better, enjoys out doors in person [employer]; identifies it as a main support system; continues to be encouraged to attend sober support groups online or in person12/2020Pt reports continued sobriety, compliant with MAT, reports occasional depressed mood due to limitations/restrictions caused by pandemic;reports feeling more hopeful when he thinks of how close we are t getting a vaccine states that it does not last for more than a couple of days at a time; identifies going to church in person and meeting up with his friends there as uplifting; does virtual bible study classes every Wednesdays, misses doing meals on wheels; states he stays busy getting things done around the house and feels good when he tackles small tasks like  clearing the clutter from his table; enjoys zoom calls with his daughters; pt continues to be encouraged to attend sober support groups online3/2021Pt reports continued sobriety, compliant with MAT, increasing discouraged by limitations due to COVID19- hopeful of getting ting vaccinated soon; working through Therapist, sports; feels supported and well taken care of by PCP; identifies his faith and church activities as his main coping skills; enjoys face time with his daughter and grandson in [place]; pt continues to be reluctant to engage in online/i person sober support groups5/25/2021Pt is sober, reports compliance with MAT; attends [facility] regularly; presents as more tangential, and more difficult to redirect; expresses increased anxiety around difficulty completing tasks; identifies procrastination as a huge challenge; discouraged in his marriage; feels close and supported by his daughter in [place] and his sister in [place]; client has been encouraged to attend sober support groups but it resistant to doing so; identifies Church as his main support system and his faith as his coping skill; 8/9/2021Pt reports continued sobriety, compliance with MAT; pt struggles with loneliness, low mood and energy morre frequently than in the past; makes effort o stay busy and structure his day;identifies face timing with daughter and grandson in [place] as a mood booster; identifies church as giving him sense of meaningfulness and belongingnessPatient Stated Goal(s):  To stay sober and be happy and have a sense of purpose...per ptLong Term Goal(s):  Medication compliance/adjustments, Prevent recurrence/exacerbation of symptoms and Caregiver engagement in discharge plan/follow-up treatment     Target Date:  11/2021Short Term Goal/Objective:  Patient will report a decrease in substance use as evidenced by ? self report, -ve UDS and breathalyzers     Target Date: 08/2020     Status:  Achieved and ongoingShort Term Goal/Objective:  Patient will identify/practice/demonstrate the use of adaptive coping skills as evidenced by ? verbalizing and demonstrating emotional regulation and stress management skills     Target Date: 08/2020     Status:  Partially achievedShort Term Goal/Objective:  Patient will increase their sober support network as evidenced by ? attending online sober support groups 1-2 x weekly     Target Date: 08/2020     Status:  Minimal progressIntervention/Frequency:  Encourage identification/practice/mastery of adaptive coping skills weekly by phone/ zoom video due to COVID 19 precautionsIntervention/Frequency:  Educate patients on the benefits of joining a support group and utilizing support systems weekly by phone/zoom video due to COVID 19 precautionsIntervention/Frequency:  Encourage patient to Identify connection between feeling, thoughts and behaviors weekly by phone/zoom video due to COVID 19 precautionsOther Assessed Need(s) (Medical/Psychosocial)(List any other assessed needs that were identified in the assessment of the patient that are NOT included in the Interdisciplinary Problem List noted above.  Indicate what the INTERVENTION, REFERRAL or DEFERRAL will be for each of the other Assessed Needs identified (e.g. name of PCP, other medical provider, therapist, community resource, etc.)No other assessed need identifiedMedical / Psychological Assessments: Medications:Assessment of medication side effects and/or adverse medication reactions is ongoing     Current Outpatient Medications Medication Sig Note ? acetaminophen (TYLENOL) 325 MG tablet Take 650 mg by mouth every 6 (six) hours as needed..  ? buprenorphine-naloxone (SUBOXONE) 4-1 mg per sublingual film Place 1 Film under the tongue daily.  ? buPROPion XL (WELLBUTRIN XL) 300 mg 24 hr tablet Take 1 tablet (300 mg total) by mouth every morning.  ? buPROPion XL (WELLBUTRIN XL) 300 mg 24 hr tablet Take 1 tablet (300 mg total) by mouth every morning.  ? caffeine 200 mg Tab  tablet Take 200 mg by mouth daily.  ? calcium carbonate (TUMS) *200 mg CALCIUM* chewable tablet Take 3 tablets by mouth daily.  ? chlordiazePOXIDE (LIBRIUM) 25 MG capsule Take 25 mg by mouth 2 (two) times daily as needed for Anxiety.  ? clobetasol (TEMOVATE) 0.05 % cream APPLY SPARINGLY TO AFFECTED AREA TWICE A DAY  ? co-enzyme Q-10 30 mg capsule Take 30 mg by mouth daily.  ? escitalopram oxalate (LEXAPRO) 10 mg tablet Take 1 tablet (10 mg total) by mouth every morning.  ? fish oil-omega-3 fatty acids 1,000 mg capsule Take 2 g by mouth daily.  ? furosemide (LASIX) 40 MG tablet TAKE 1 AND 1/2 TABLETS ORALLY DAILY AS DIRECTED 07/23/2016: Received from: External Pharmacy ? glucosamine-chondroitin 500-400 mg tablet Take 2 tablets by mouth daily.  ? MATZIM LA 360 mg LA 24 hr extended release tablet   ? mupirocin (BACTROBAN) 2 % ointment   ? tadalafil (CIALIS) 20 MG tablet Take 20 mg by mouth as needed for Erectile Dysfunction.  ? warfarin (COUMADIN) 6 MG tablet TAKE 1 TABLET BY MOUTH EVERY DAY  No current facility-administered medications for this encounter.  Consult / Referral Made:No Consults or Referrals made at this timeDischarge Planning: Anticipated Discharge Date: 3 monthsAnticipated Discharge Level of Care/ Disposition:Outpatient TherapyPlan: Pt will stay sober, increase sober support; stay compliant with meds

## 2020-06-01 DIAGNOSIS — F1121 Opioid dependence, in remission: Secondary | ICD-10-CM

## 2020-06-08 ENCOUNTER — Inpatient Hospital Stay: Admit: 2020-06-08 | Discharge: 2020-06-08 | Payer: MEDICARE | Primary: Internal Medicine

## 2020-06-09 NOTE — Group Note
Group Session:  Alcohol/Substance Abuse RecoveryGroup Start Time:    9:00 AM      End Time:  10:30 AMFacilitators:  Renato Gails, LADC This clinician is conducting this visit at a site within our enrolled clinical location.. For this visit the clinician and patient were present via interactive audio & video telecommunications system that permits real-time communications. This visit type was necessary for this patient in place of an in-person visit due to the COVID-19 outbreak. Patient counseled on available options for visit type; Patient elected video visit and was made aware they are able to opt out or refuse this service at any time; Patient consent given for video visit: Yes. The patient?s identity was verified by asking the patient to state their name and date of birth at the start of the encounter.State patient is located in: [place]The clinician is appropriately licensed in the above state to provide care for this visit.Other individuals present during the telehealth encounter and their role/relation: none and other group membersSession Focus Relapse prevention Number of Participants 4 Treatment Modality Group Therapy Interventions/Education Relapse Prevention InterventionsEducated clients of the benefits of joining a support groupHelped patient increase community based supportsHelped patient develop strategies to successfully resist cravingsIdentified alternative activities to structure leisure timeIdentified positive outcomes for refraining from substances Attendance            AttendedEngagement InterventionsNot applicable - patient attended group without difficulty Degree of Participation Active Behavior Appropriate Speech/Thought Process Focused Affect/Mood Full range Insight Good Judgment Good Response to Interventions Attentive Individualization [place] - Suicide Severity Screen    Most Recent Value Have you wished you were dead or wished you could go to sleep and not wake up? no Have you actually had any thoughts of killing yourself? no Have you ever done anything, started to do anything, or prepared to do anything to end your life? no [place] Suicide Risk Level Low Risk  pt supportive and encouraging of peers; continues to work through [employer]; states that he consciously focuses on the bright spots in his life: face timing with his grandson; his wife's garden; doing errands together and church activities Plan Continue to assist patient with relapse prevention

## 2020-06-15 ENCOUNTER — Inpatient Hospital Stay: Admit: 2020-06-15 | Discharge: 2020-06-15 | Payer: MEDICARE | Primary: Internal Medicine

## 2020-06-15 DIAGNOSIS — F1121 Opioid dependence, in remission: Secondary | ICD-10-CM

## 2020-06-16 NOTE — Group Note
Group Session:  Alcohol/Substance Abuse RecoveryGroup Start Time:    9:00 AM      End Time:  10:30 AMFacilitators:  Renato Gails, LADC This clinician is conducting this visit at a site within our enrolled clinical location.. For this visit the clinician and patient were present via interactive audio & video telecommunications system that permits real-time communications. This visit type was necessary for this patient in place of an in-person visit due to the COVID-19 outbreak. Patient counseled on available options for visit type; Patient elected video visit and was made aware they are able to opt out or refuse this service at any time; Patient consent given for video visit: Yes. The patient?s identity was verified by asking the patient to state their name and date of birth at the start of the encounter.State patient is located in: [place]The clinician is appropriately licensed in the above state to provide care for this visit.Other individuals present during the telehealth encounter and their role/relation: other group membersSession Focus Relapse preventionPt's discussed loneliness as a trigger, watched the importance of emotional first aid -[employer] and discussed ways to manage/minimize loneliness Number of Participants 6 Treatment Modality Group Therapy Interventions/Education Relapse Prevention InterventionsEducated clients of the benefits of joining a support groupHelped patient become skilled in recognizing/process/coping with thoughts/feelingsIdentified alternative activities for social connectionIdentified alternative activities to structure leisure timeIdentified positive outcomes for refraining from substances Attendance            AttendedEngagement InterventionsNot applicable - patient attended group without difficulty Degree of Participation Active Behavior Appropriate Speech/Thought Process Focused Affect/Mood Full range Insight Good Judgment Good Response to Interventions Attentive Individualization [place] - Suicide Severity Screen    Most Recent Value Have you wished you were dead or wished you could go to sleep and not wake up? no Have you actually had any thoughts of killing yourself? no Have you ever done anything, started to do anything, or prepared to do anything to end your life? no [place] Suicide Risk Level Low Risk  pt reports compliance with MAT; stable mood; states he struggles with lack of energy but does not allow it to keep him from doing things because staying stuck at home causes him to feel frustrated and lonely; states that comparing his life with others esp in [place] helps him feel grateful for what he has Plan Continue to assist patient with relapse prevention

## 2020-06-17 ENCOUNTER — Encounter: Admit: 2020-06-17 | Payer: PRIVATE HEALTH INSURANCE | Attending: Psychiatry | Primary: Internal Medicine

## 2020-06-17 MED ORDER — BUPRENORPHINE 4 MG-NALOXONE 1 MG SUBLINGUAL FILM
4-1 mg | ORAL_FILM | Freq: Every day | SUBLINGUAL | 1 refills | Status: AC
Start: 2020-06-17 — End: 2020-07-22

## 2020-06-21 DIAGNOSIS — F1121 Opioid dependence, in remission: Secondary | ICD-10-CM

## 2020-06-22 ENCOUNTER — Inpatient Hospital Stay: Admit: 2020-06-22 | Discharge: 2020-06-22 | Payer: MEDICARE | Primary: Internal Medicine

## 2020-06-22 NOTE — Group Note
Group Session:  Alcohol/Substance Abuse RecoveryGroup Start Time:    9:00 AM      End Time:  10:30 AMFacilitators:  Renato Gails, LADC This clinician is conducting this visit at a site within our enrolled clinical location.. For this visit the clinician and patient were present via interactive audio & video telecommunications system that permits real-time communications. This visit type was necessary for this patient in place of an in-person visit due to the COVID-19 outbreak. Patient counseled on available options for visit type; Patient elected video visit and was made aware they are able to opt out or refuse this service at any time; Patient consent given for video visit: Yes. The patient?s identity was verified by asking the patient to state their name and date of birth at the start of the encounter.State patient is located in: [place]The clinician is appropriately licensed in the above state to provide care for this visit.Other individuals present during the telehealth encounter and their role/relation: none and other group membersSession Focus Relapse prevention Number of Participants 6 Treatment Modality Group Therapy Interventions/Education Relapse Prevention InterventionsEducated clients of the benefits of joining a support groupHelped patient become skilled in recognizing/process/coping with thoughts/feelingsIdentified alternative activities for social connectionIdentified alternative activities to structure leisure timeIdentified positive outcomes for refraining from substancesIdentified thoughts/feelings that lead to relapse Attendance            AttendedEngagement InterventionsNot applicable - patient attended group without difficulty Degree of Participation Active Behavior Appropriate Speech/Thought Process Focused Affect/Mood Full range Insight Moderate Judgment Good Response to Interventions Attentive Individualization [place] - Suicide Severity Screen    Most Recent Value Have you wished you were dead or wished you could go to sleep and not wake up? no Have you actually had any thoughts of killing yourself? no Have you ever done anything, started to do anything, or prepared to do anything to end your life? no [place] Suicide Risk Level Low Risk   Plan Continue to assist patient with relapse prevention

## 2020-06-29 ENCOUNTER — Inpatient Hospital Stay: Admit: 2020-06-29 | Discharge: 2020-06-29 | Payer: MEDICARE | Primary: Internal Medicine

## 2020-06-29 NOTE — Group Note
Group Session:  Alcohol/Substance Abuse RecoveryGroup Start Time:    9:00 AM      End Time:  10:30 AMFacilitators:  Renato Gails, LADC Group Session:  Alcohol/Substance Abuse RecoveryGroup Start Time:    9:00 AM      End Time:  10:30 AMFacilitators:  Renato Gails, LADC TELEHEALTH VISIT:  This clinician is part of the telehealth program and is conducting this visit in a currently approved location.-  For this visit the clinician and patient were present via:   interactive audio and video telecommunications system that permits real-time communications-  Consent was signed via the Patient Acknowledgement and Financial Authorization Form and the Ambulatory Telehealth Consent Form-  State patient is located in: [place]-  The clinician is appropriately licensed in the above state to provide care for this visit.-  Clinician is physically located on site-  Other individuals present during the telehealth encounter and their role/relation: other group membersSession Focus Relapse preventionGroup discussed loneliness as a potential trigger Number of Participants 6 Treatment Modality Group Therapy Interventions/Education Relapse Prevention InterventionsHelped patient become skilled in recognizing/process/coping with thoughts/feelingsHelped patient identify relapse prevention strategiesHelped patient increase community based supportsHelped patient develop strategies to successfully resist cravingsHelped patient increase awareness of support groupsIdentified alternative activities for social connectionIdentified alternative activities to structure leisure timeIdentified positive outcomes for refraining from substances Attendance            AttendedEngagement InterventionsNot applicable - patient attended group without difficulty Degree of Participation Moderate Behavior Appropriate Speech/Thought Process Focused Affect/Mood Full range Insight Moderate Judgment Moderate Response to Interventions Attentive Individualization [place] - Suicide Severity Screen    Most Recent Value Have you wished you were dead or wished you could go to sleep and not wake up? no Have you actually had any thoughts of killing yourself? no Have you ever done anything, started to do anything, or prepared to do anything to end your life? no [place] Suicide Risk Level Low Risk  pt related to feeling lonely a lot; states that he seeks out the company of like minded church goers, enjoyed the [employer] with others suffering from Crohns yesterday because it helped him feel like he is less alone; states that he thinks he may be watching the news too much because it;s all bad stuff Plan Continue to assist patient with relapse prevention

## 2020-07-04 ENCOUNTER — Inpatient Hospital Stay: Admit: 2020-07-04 | Discharge: 2020-07-04 | Payer: MEDICARE | Primary: Internal Medicine

## 2020-07-05 LAB — URINE DRUG SCREEN, MEDTOX     (GH)
BKR AMPHETAMINE: NEGATIVE
BKR BARBITURATES: NEGATIVE
BKR BENZODIAZEPINES: POSITIVE — AB
BKR BUPRENORPHINE: POSITIVE — AB
BKR COCAINE (BENZOYLECGONINE): NEGATIVE
BKR METHADONE: NEGATIVE
BKR METHAMPHETAMINE: NEGATIVE
BKR OPIATES: NEGATIVE ng/mL
BKR OXYCODONE: NEGATIVE
BKR PCP, PHENCYCLIDINE: NEGATIVE
BKR PROPOXYPHENE: NEGATIVE
BKR TCA, TRICYCLIC ANTIDEPRESSANTS: NEGATIVE
BKR THC, CANNABINOIDS: NEGATIVE

## 2020-07-05 LAB — ETHANOL, URINE, RANDOM     (BH GH LMW): BKR ETHANOL URINE: NEGATIVE

## 2020-07-06 ENCOUNTER — Inpatient Hospital Stay: Admit: 2020-07-06 | Discharge: 2020-07-06 | Payer: MEDICARE | Primary: Internal Medicine

## 2020-07-06 DIAGNOSIS — F1121 Opioid dependence, in remission: Secondary | ICD-10-CM

## 2020-07-06 MED ORDER — BUPROPION HCL XL 300 MG 24 HR TABLET, EXTENDED RELEASE
300 mg | ORAL_TABLET | Freq: Every morning | ORAL | 2 refills | Status: AC
Start: 2020-07-06 — End: 2020-11-02

## 2020-07-06 MED ORDER — ESCITALOPRAM 10 MG TABLET
10 mg | ORAL_TABLET | Freq: Every morning | ORAL | 2 refills | Status: AC
Start: 2020-07-06 — End: 2020-09-01

## 2020-07-06 NOTE — Progress Notes
[facility] [address]Green[address] [place] [address]UEAVW Number: [phone] Medication Management Psychiatry MD/LIP Progress Note9/15/2021Visit Start Time:  02:40PM      Visit End Time:  03:10PMAdditional time spent reviewing chart on day of visit: 15 minutesSession Type:  Medication ManagementJames Keny Armand is a 69 y.o., Married, male.SUBJECTIVE Chief Complaint: Patient is a 69 y.o. 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 Says that he has issues with his stomach and GI system w/ the Crohn's and his appetite but that these are chronic. His mood is pretty good. He has been adherent with the Lexapro and Wellbutrin. Sleeping fine if he takes the temazepam. We discussed how it is not ideal to be on this benzodiazepine at his age and with his concurrent buprenorphine but pt says he has been on a benzodiazepine for years having previously taken chlordiazepoxide (Librium). Says he does take the temazepam most nights but sometimes takes 1/2 a tab. Mood is good. Not feeling sad, hopeless or suicidal. No mood elevations. No anxiety. Physically is fine. Pt is in good spirits and has no other complaints at this time. Some annoyance about the cost of his meds and whether he needs to use mail order or not. Otherwise, he is pretty good.REVIEW OF ALLERGIES/MEDICATIONS/HISTORY I have reviewed the patient's current medications, allergies and past medical historyI have reviewed with the patient, the risks and benefits of the medications prescribed.OBJECTIVE Review of SystemsReview of Systems LabsI have reviewed the patients lab results within the last 2 weeks.  Significant findings are + benzodiazepines and + buprenorphine (both prescribed)Current MedicationsCurrent Outpatient Medications Medication Sig Note ? buprenorphine-naloxone (SUBOXONE) 4-1 mg per sublingual film Place 1 Film under the tongue daily.  ? buPROPion XL (WELLBUTRIN XL) 300 mg 24 hr tablet Take 1 tablet (300 mg total) by mouth every morning.  ? escitalopram oxalate (LEXAPRO) 10 mg tablet Take 1 tablet (10 mg total) by mouth every morning.  ? acetaminophen (TYLENOL) 325 MG tablet Take 650 mg by mouth every 6 (six) hours as needed..  ? caffeine 200 mg Tab tablet Take 200 mg by mouth daily.  ? calcium carbonate (TUMS) *200 mg CALCIUM* chewable tablet Take 3 tablets by mouth daily.  ? chlordiazePOXIDE (LIBRIUM) 25 MG capsule Take 25 mg by mouth 2 (two) times daily as needed for Anxiety. (Patient not taking: Reported on 07/06/2020)  ? clobetasol (TEMOVATE) 0.05 % cream APPLY SPARINGLY TO AFFECTED AREA TWICE A DAY  ? co-enzyme Q-10 30 mg capsule Take 30 mg by mouth daily.  ? fish oil-omega-3 fatty acids 1,000 mg capsule Take 2 g by mouth daily.  ? furosemide (LASIX) 40 MG tablet TAKE 1 AND 1/2 TABLETS ORALLY DAILY AS DIRECTED 07/23/2016: Received from: External Pharmacy ? glucosamine-chondroitin 500-400 mg tablet Take 2 tablets by mouth daily.  ? MATZIM LA 360 mg LA 24 hr extended release tablet   ? mupirocin (BACTROBAN) 2 % ointment   ? tadalafil (CIALIS) 20 MG tablet Take 20 mg by mouth as needed for Erectile Dysfunction.  ? warfarin (COUMADIN) 6 MG tablet TAKE 1 TABLET BY MOUTH EVERY DAY  No current facility-administered medications for this encounter. Mental Status ExamGeneral  AppearanceHabitus:  ThinGrooming:  GoodMusculoskeletalGait and Station: no poor posturePsychiatricAttitude: Cooperative, pleasant and good eye contactPsychomotor Behavior: No psychomotor activation or retardationSpeech: normal rate, volume and prosody hyperverbalMood: Normal, calm and euthymic not depressed, not anxious, not irritableAffect: Congruent to reported mood, full range and appropriately reactiveThought Process: circumstantialAssociations: NormalThought Content: NormalSuicidal Ideation: No current suicidal plan, ideation or intentJudgment:  GoodInsight:  GoodCognitive EvaluationOrientation: Oriented to person, oriented to place and oriented to date/time Safety and Risk AssessmentRISK ASSESSMENT with C-SSRS and SAFE-TPSY RISK ASSESSMENT SAFE-T WITH C-SSRS  Reason for Assessment:  Routine Ambulatory Psychiatric AssessmentC-SSRS: Suicidal Ideation:  Since Last Visit  WISH TO BE DEAD:  No  CURRENT SUICIDAL THOUGHTS:  No  Have you ever done anything, started to do anything or prepared to do anything to end your life?:  NoRisk Assessment:   Access to Lethal Methods:  NoCurrent and Past Psychiatric Diagnoses:    Mood Disorder:  Recurrent/Current  Psychotic Disorder:  No  Alcohol/Substance Abuse Disorders:  Sustained Remission  PTSD:  No  ADHD:  Defer for Further Assessment  TBI:  No  Cluster B Personality Disorders or Traits:  No  Conduct Problems:  No  Suicide Attempt:  No Prior Attempts  Presenting Symptoms:  Anhedonia:  No  Impulsivity:  No  Hopelessness or Despair:  No  Anxiety and/or Panic:  No  Insomnia:  No  Command Hallucinations:  No  Psychosis:  No  Self-injurious Behavior:  No  Physical Aggression Toward Others:  No  Violence, Victimization and/or Perpetration:  No  Family History:   Suicide:  No  Suicidal Behavior:  No  Axis I Psychiatric Diagnosis requiring hospitalization:  No  Precipitants / Stressors:   Triggering events leading to humiliation, shame and/or despair:  No  Chronic physical pain or other acute medical problem:  Yes  Sexual / Physical Abuse:  No  Substance Intoxication or Withdrawal:  No  Pending Incarceration:  No  Legal Problems:  No  Inadequate Social Supports:  No  Social Isolation:  No  Perceived burden on others:  No  Bullying / Cyberbullying:  No  Media portrayal of suicide:  No  Housing Insecurity:  No  Financial Insecurity:  No  Stressful Life Events:  No  Gender / Sexual Identity:  No  Homelessness:  No  Change in Treatment:    Recent inpatient discharge:  No  Change in provider or treatment:  No  Hopeless or dissatisfied with provider or treatment:  No  Non-compliant:  No  Not receiving treatment:  NoCurrent and Past Psychiatric Diagnoses:  Mood Disorder:  Recurrent/Current  Psychotic Disorder:  No  Alcohol/Substance Abuse Disorders:  Sustained Remission  PTSD:  No  ADHD:  Defer for Further Assessment  TBI:  No  Cluster B Personality Disorders or Traits:  No  Conduct Problems:  No  Suicide Attempt:  No Prior AttemptsProtective Factors:   Internal:   Ability to cope with stress:  Yes  Frustration tolerance:  Yes  Religious beliefs:  Yes  Fear of death or the actual act of killing self:  Yes  Identifies reasons for living:  Yes  Problem solving skills:  Yes  External:   Cultural, spiritual and/or moral attitudes against suicide:  Yes  Responsibility to children:  Yes  Beloved pets:  No  Supportive social network of friends or family:  Yes  Positive therapeutic relationships / Effective mental healthcare:  Yes  Step 2 External - Engaged in work/school: n/a.Risk to Self - Self-Injurious Behavior:   Current Urges  to harm Self:  No  Recent Self-Injury:  No  History of Self Injury:  No  Attitude Regarding Self Injury:  Ego dystonic  Imminent Risk for Self Injury in Community:  Low  Imminent Risk for Self Injury in Facility:  LowRisk to Others:   Current Agitation:  No  Homicidal/Aggressive Ideation:  No  Homicidal/Aggressive Threat/Plan:  No  Recent Violence/Aggression:  No  Attitude Regarding Aggression / Violence:  Ego dystonic  Imminent Risk for Violence in Community:  Low  Imminent Risk for Violence in Facility:  LowWithdrawal Risk:   Alcohol / Benzodiazepines / Barbiturates:  Daily use (prescribed librium)     Alcohol/Benzodiazepine/Barbiturate Risk for Withdrawal:  Low  Opioids:  Daily use (prescribed suboxone)     Opioid Risk for Withdrawal:  Low?MEDICAL DECISION MAKING DiagnosisProblem List         ICD-10-CM   Y IOP/PHP THERAPY PLAN  Opioid use disorder, moderate, in sustained remission, on maintenance therapy, 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	Number and complexity of problems (Low, moderate, or high): Moderate2.	Amount and Complexity of data reviewed and analyzed (Low, moderate, or high): Low3.	Risk of Complications of morbidity and mortality of patient management (Low, moderate, or high): LowOverall Risk AssessmentModerate - two or more stable chronic illnesses (includes prescription drug management)IMPRESSION & PLAN Formulation / AssessmentPatient is a 69 y.o. 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 Pt also seems stable on his Suboxone dose and not misusing his meds including his prescribed temazepam. Made aware of risks of taking a benzodiazepine given the opioid prescription and his age but will continue to monitor given his chronic hx of being prescribed a benzodiazepine.The Self-Report [place] Risk Suicide Severity Rating Scale Since Last Visit was completed by Ramon Dredge and reviewed by myself today.  Additionally, risk assessment from Estanislado Pandy Ahuja's intake assessment and follow up screenings have been reviewed. Based on Naftula Donahue Molla?s clinical presentation today as well as his self-reported symptom ratings today and over the current course of treatment, I have assessed Ramon Dredge to be at unchanged risk of harm to self as compared with prior recent assessments.  Based on this assessment, I have continued current treatment plan including implementation of factors to mitigate risks of harm to self as outlined in the last risk assessmentIntervention(s)Medication ManagementLabs/Test/Consults OrderedToxicologyMedication ChangesNonePlan- Continue prescription of buprenorphine-naloxone 4-1mg  daily for opioid maintenance- Continue escitalopram 10mg  daily for depression- Continue bupropion XL 300mg  daily for depression- Continue [facility]- Return to see this MD in clinic in 4 weeks, call earlier as needed for medication refill etc.Electronically SignedProvider:  Harlene Ramus, M.D.9/15/20213:12 PM

## 2020-07-06 NOTE — Group Note
Group Session:  Alcohol/Substance Abuse RecoveryGroup Start Time:    9:00 AM      End Time:  10:30 AMFacilitators:  Renato Gails, LADC Group Start Time:    9:00 AM      End Time:  10:30 AMFacilitators:  Renato Gails, LADC.TELEHEALTH VISIT:  This clinician is part of the telehealth program and is conducting this visit in a currently approved location.-  For this visit the clinician and patient were present via:   interactive audio and video telecommunications system that permits real-time communications-  Consent was signed via the Patient Acknowledgement and Financial Authorization Form and the Ambulatory Telehealth Consent Form-  State patient is located in: [place]-  The clinician is appropriately licensed in the above state to provide care for this visit.-  Clinician is physically located on site-  Other individuals present during the telehealth encounter and their role/relation:other group membersSession Focus Relapse preventionGroup discussed ways that the cognitive distortion of personalization contributes to feelings of loneliness in recovery and how to avoid it Number of Participants 4 Treatment Modality Group Therapy Interventions/Education Relapse Prevention InterventionsEducated clients of the benefits of joining a support groupHelped patient become skilled in recognizing/process/coping with thoughts/feelingsHelped patient draft a relapse prevention planIdentified alternative activities for social connectionIdentified alternative activities to structure leisure timeIdentified positive outcomes for refraining from substancesIdentified thoughts/feelings that lead to relapse Attendance            AttendedEngagement InterventionsNot applicable - patient attended group without difficulty Degree of Participation Active Behavior Appropriate Speech/Thought Process Focused Affect/Mood Full range Insight Good Judgment Moderate Response to Interventions Attentive Individualization Pt shared sadness over his wife's cousin's death in the [place] from [place]; aware that he is also angry with her cousin for not taking the vaccine when she could have;pt supportive of peers and related to how he struggles with loneliness; states that attending church services in person helps him feel more connected and less alone[place] - Suicide Severity Screen    Most Recent Value Have you wished you were dead or wished you could go to sleep and not wake up? no Have you actually had any thoughts of killing yourself? no Have you ever done anything, started to do anything, or prepared to do anything to end your life? no [place] Suicide Risk Level Low Risk   Plan Continue to assist patient with relapse prevention

## 2020-07-07 LAB — NOTES AND COMMENTS

## 2020-07-07 LAB — ALCOHOL METABOLITES, QUANT, URINE     (BH GH)
ETHYL GLUCURONIDE: NEGATIVE ng/mL (ref <500)
ETHYL SULFATE: NEGATIVE ng/mL (ref <100)

## 2020-07-13 ENCOUNTER — Inpatient Hospital Stay: Admit: 2020-07-13 | Discharge: 2020-07-13 | Payer: MEDICARE | Primary: Internal Medicine

## 2020-07-13 DIAGNOSIS — F1121 Opioid dependence, in remission: Secondary | ICD-10-CM

## 2020-07-13 NOTE — Telephone Encounter
Pt left v/m stating he had sprained his back and owuld be absent from 9/22 [facility]

## 2020-07-19 ENCOUNTER — Encounter: Admit: 2020-07-19 | Payer: PRIVATE HEALTH INSURANCE | Primary: Internal Medicine

## 2020-07-20 ENCOUNTER — Inpatient Hospital Stay: Admit: 2020-07-20 | Discharge: 2020-07-20 | Payer: MEDICARE | Primary: Internal Medicine

## 2020-07-20 DIAGNOSIS — F1121 Opioid dependence, in remission: Secondary | ICD-10-CM

## 2020-07-20 NOTE — Group Note
Group Session:  Alcohol/Substance Abuse RecoveryGroup Start Time:    9:00 AM      End Time:  10:30 AMFacilitators:  Aalto-Fischer, Aleshia Cartelli E, LCSWSession Focus Relapse prevention Number of Participants 5 Treatment Modality Group Therapy Interventions/Education Relapse Prevention InterventionsPt. explored thoughts, feelings and behaviors connected to life stressors to increase self awareness and build on relapse preventionReceived and gave support and encouragement to increase connection Attendance            AttendedEngagement InterventionsNot applicable - patient attended group without difficulty Degree of Participation Active Behavior Appropriate, Cooperative and Interactive Speech/Thought Process Coherent Affect/Mood Euthymic, Full range and Stable Insight Good Judgment Good Response to Interventions Engaged and Receptive Individualization TELEHEALTH VISIT:  This clinician is part of the telehealth program and is conducting this visit in a currently approved location.-  For this visit the clinician and patient were present via:   interactive audio and video telecommunications system that permits real-time communications-  Consent was signed via the Patient Acknowledgement and Financial Authorization Form and the Ambulatory Telehealth Consent Form-  State patient is located in: [place]-  The clinician is appropriately licensed in the above state to provide care for this visit.-  Clinician is physically located at home-  Other individuals present during the telehealth encounter and their role/relation: other group members.Pt. was informed of the new Pt. Contract being in MyChart for their review, encouraged to ask questions if any and sign it when next time at [facility].In the beginning of the group pt. was educated about thoughts, feelings and behaviors being connected and if working on changing one it will change others. This pt. reported maintained recovery since last in group.   During the group shares all pts. focused on exploring their thoughts, feelings and behaviors connected to life stressors and coping tools used to build on relapse prevention and to increase self awareness.  This pt. shared had a tough week while dealing with his physical pain issues connected to his back and how he is regulating his feelings and conneting with his support systems such as his church, Bible study, his wife, his friend, daughter and how seeing his grandson gives him joy.  He was engaged with others in the group during the group conversations and shares about the importance of connection to this group and the benefits of it in recovery as well as how shifting the focus, putting things in perspective, connected to supportive people, gratitude can be beneficial in recovery process.Group ended with this pt. along with everyone else sharing what they are grateful for.[place] - Suicide Severity Screen    Most Recent Value Have you wished you were dead or wished you could go to sleep and not wake up? no Have you actually had any thoughts of killing yourself? no Have you ever done anything, started to do anything, or prepared to do anything to end your life? no [place] Suicide Risk Level Low Risk   Plan Continue to assist patient with relapse preventionContinue per Plan of Care / Interdisciplinary Plan of Care

## 2020-07-21 ENCOUNTER — Encounter: Admit: 2020-07-21 | Payer: PRIVATE HEALTH INSURANCE | Attending: Psychiatry | Primary: Internal Medicine

## 2020-07-21 DIAGNOSIS — F1121 Opioid dependence, in remission: Secondary | ICD-10-CM

## 2020-07-22 MED ORDER — BUPRENORPHINE 4 MG-NALOXONE 1 MG SUBLINGUAL FILM
4-1 mg | ORAL_FILM | Freq: Every day | SUBLINGUAL | 1 refills | Status: AC
Start: 2020-07-22 — End: 2020-08-16

## 2020-07-26 ENCOUNTER — Encounter: Admit: 2020-07-26 | Payer: PRIVATE HEALTH INSURANCE | Attending: Internal Medicine | Primary: Internal Medicine

## 2020-07-26 ENCOUNTER — Inpatient Hospital Stay: Admit: 2020-07-26 | Discharge: 2020-07-26 | Payer: MEDICARE | Primary: Internal Medicine

## 2020-07-26 DIAGNOSIS — M545 Low back pain, unspecified back pain laterality, unspecified chronicity, unspecified whether sciatica present: Secondary | ICD-10-CM

## 2020-07-26 DIAGNOSIS — F3340 Major depressive disorder, recurrent, in remission, unspecified: Secondary | ICD-10-CM

## 2020-07-26 DIAGNOSIS — F1121 Opioid dependence, in remission: Secondary | ICD-10-CM

## 2020-07-26 DIAGNOSIS — F419 Anxiety disorder, unspecified: Secondary | ICD-10-CM

## 2020-07-26 LAB — ETHANOL, URINE, RANDOM     (BH GH LMW): BKR ETHANOL URINE: NEGATIVE

## 2020-07-26 NOTE — Progress Notes
Tyler Khan Thales Knipple is a 69 y.o., Married, male.SUBJECTIVE Chief Complaint: Patient is a 69 y.o. 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 Lenis Noon told him that he should come in for an x-ray and he is going to get his scan after our meeting today. Worries that it may be a compression fracture. Plans to meet with Dr. Lenis Noon later this week. Says he is not having any new physical complaints. Does have low energy but thinks this is due to his physical pain. Appetite fine. Not feeling sad or hopeless or suicidal. No cravings. Feels the Suboxone is helping at the current dose. Sleep is fine. No other complaints.REVIEW OF ALLERGIES/MEDICATIONS/HISTORY I have reviewed the patient's current medications, allergies and past medical historyI have reviewed with the patient, the risks and benefits of the medications prescribed.OBJECTIVE Review of SystemsReview of Systems Constitutional: Positive for fatigue. Respiratory: Negative.  Cardiovascular: Negative.  Gastrointestinal: Negative.  Musculoskeletal: Positive for arthralgias, back pain and gait problem. Neurological: Positive for weakness. Psychiatric/Behavioral: Negative for behavioral problems, decreased concentration, dysphoric mood, sleep disturbance and suicidal ideas. The patient is not nervous/anxious.   LabsI have reviewed the patients lab results within the last 4 weeks.  Significant findings are + benzodiazepines and + buprenorphine (both prescribed)Current MedicationsCurrent Outpatient Medications Medication Sig Note ? acetaminophen (TYLENOL) 325 MG tablet Take 650 mg by mouth every 6 (six) hours as needed..  ? buprenorphine-naloxone (SUBOXONE) 4-1 mg per sublingual film Place 1 Film under the tongue daily.  ? buPROPion XL (WELLBUTRIN XL) 300 mg 24 hr tablet Take 1 tablet (300 mg total) by mouth every morning.  ? caffeine 200 mg Tab tablet Take 200 mg by mouth daily.  ? calcium carbonate (TUMS) *200 mg CALCIUM* chewable tablet Take 3 tablets by mouth daily.  ? chlordiazePOXIDE (LIBRIUM) 25 MG capsule Take 25 mg by mouth 2 (two) times daily as needed for Anxiety. (Patient not taking: Reported on 07/06/2020)  ? clobetasol (TEMOVATE) 0.05 % cream APPLY SPARINGLY TO AFFECTED AREA TWICE A DAY  ? co-enzyme Q-10 30 mg capsule Take 30 mg by mouth daily.  ? escitalopram oxalate (LEXAPRO) 10 mg tablet Take 1 tablet (10 mg total) by mouth every morning.  ? fish oil-omega-3 fatty acids 1,000 mg capsule Take 2 g by mouth daily.  ? furosemide (LASIX) 40 MG tablet TAKE 1 AND 1/2 TABLETS ORALLY DAILY AS DIRECTED 07/23/2016: Received from: External Pharmacy ? glucosamine-chondroitin 500-400 mg tablet Take 2 tablets by mouth daily.  ? MATZIM LA 360 mg LA 24 hr extended release tablet   ? mupirocin (BACTROBAN) 2 % ointment   ? tadalafil (CIALIS) 20 MG tablet Take 20 mg by mouth as needed for Erectile Dysfunction.  ? warfarin (COUMADIN) 6 MG tablet TAKE 1 TABLET BY MOUTH EVERY DAY  No current facility-administered medications for this encounter. Mental Status ExamGeneral AppearanceHabitus:  ThinGrooming:  GoodMusculoskeletalGait and Station: no poor posturePsychiatricAttitude: Cooperative, pleasant and good eye contactPsychomotor Behavior: No psychomotor activation or retardationSpeech: normal rate, volume and prosodyMood: Normal, calm and euthymic not depressed, not anxious, not irritableAffect:  Congruent to reported mood and appropriately reactive not anxious, not dysphoric, constrictedThought Process: Coherent, logical, goal directedAssociations: NormalThought Content: NormalSuicidal Ideation: No current suicidal plan, ideation or intentJudgment:  GoodInsight:  GoodCognitive EvaluationOrientation: Oriented to person, oriented to place and oriented to date/time Safety and Risk AssessmentRISK ASSESSMENT with C-SSRS and SAFE-TPSY RISK ASSESSMENT SAFE-T WITH C-SSRS  Reason for Assessment:  Routine Ambulatory Psychiatric AssessmentC-SSRS: Suicidal Ideation:  Since Last Visit  WISH TO BE DEAD:  No  CURRENT SUICIDAL THOUGHTS:  No  Have you ever done anything, started to do anything or prepared to do anything to end your life?:  NoRisk Assessment:   Access to Lethal Methods:  NoCurrent and Past Psychiatric Diagnoses:    Mood Disorder:  Recurrent/Current  Psychotic Disorder:  No  Alcohol/Substance Abuse Disorders:  Sustained Remission  PTSD:  No  ADHD:  Defer for Further Assessment  TBI:  No  Cluster B Personality Disorders or Traits:  No  Conduct Problems:  No  Suicide Attempt:  No Prior Attempts  Presenting Symptoms:  Anhedonia:  No  Impulsivity:  No  Hopelessness or Despair:  No  Anxiety and/or Panic:  No  Insomnia:  No  Command Hallucinations:  No  Psychosis:  No  Self-injurious Behavior:  No  Physical Aggression Toward Others:  No  Violence, Victimization and/or Perpetration:  No  Family History:   Suicide:  No  Suicidal Behavior:  No  Axis I Psychiatric Diagnosis requiring hospitalization:  No  Precipitants / Stressors:   Triggering events leading to humiliation, shame and/or despair:  No  Chronic physical pain or other acute medical problem:  Yes  Sexual / Physical Abuse:  No  Substance Intoxication or Withdrawal:  No  Pending Incarceration:  No  Legal Problems:  No  Inadequate Social Supports:  No  Social Isolation:  No  Perceived burden on others:  No  Bullying / Cyberbullying:  No  Media portrayal of suicide:  No  Housing Insecurity:  No  Financial Insecurity:  No  Stressful Life Events:  No  Gender / Sexual Identity:  No  Homelessness:  No  Change in Treatment:    Recent inpatient discharge:  No  Change in provider or treatment:  No  Hopeless or dissatisfied with provider or treatment:  No  Non-compliant:  No  Not receiving treatment:  NoCurrent and Past Psychiatric Diagnoses:  Mood Disorder:  Recurrent/Current  Psychotic Disorder:  No  Alcohol/Substance Abuse Disorders:  Sustained Remission  PTSD:  No  ADHD:  Defer for Further Assessment  TBI:  No  Cluster B Personality Disorders or Traits:  No  Conduct Problems:  No  Suicide Attempt:  No Prior AttemptsProtective Factors:   Internal:   Ability to cope with stress:  Yes  Frustration tolerance:  Yes  Religious beliefs:  Yes  Fear of death or the actual act of killing self:  Yes  Identifies reasons for living:  Yes  Problem solving skills:  Yes  External:   Cultural, spiritual and/or moral attitudes against suicide:  Yes  Responsibility to children:  Yes  Beloved pets:  No  Supportive social network of friends or family:  Yes  Positive therapeutic relationships / Effective mental healthcare:  Yes  Step 2 External - Engaged in work/school: n/a.Risk to Self - Self-Injurious Behavior:   Current Urges to harm Self:  No  Recent Self-Injury:  No  History of Self Injury:  No  Attitude Regarding Self Injury:  Ego dystonic  Imminent Risk for Self Injury in  Community:  Low  Imminent Risk for Self Injury in Facility:  LowRisk to Others:   Current Agitation:  No  Homicidal/Aggressive Ideation:  No  Homicidal/Aggressive Threat/Plan:  No  Recent Violence/Aggression:  No  Attitude Regarding Aggression / Violence:  Ego dystonic  Imminent Risk for Violence in Community:  Low  Imminent Risk for Violence in Facility:  LowWithdrawal Risk:   Alcohol / Benzodiazepines / Barbiturates:  Daily use (prescribed librium)     Alcohol/Benzodiazepine/Barbiturate Risk for Withdrawal:  Low  Opioids:  Daily use (prescribed suboxone)     Opioid Risk for Withdrawal:  Low?MEDICAL DECISION MAKING DiagnosisProblem List         ICD-10-CM   Y IOP/PHP THERAPY PLAN  Opioid use disorder, moderate, in sustained remission, on maintenance therapy, dependence (HC Code) (HC CODE) 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	Number and complexity of problems (Low, moderate, or high): Moderate2.	Amount and Complexity of data reviewed and analyzed (Low, moderate, or high): Low3.	Risk of Complications of morbidity and mortality of patient management (Low, moderate, or high): LowOverall Risk AssessmentModerate - two or more stable chronic illnesses (includes prescription drug management)IMPRESSION & PLAN Formulation / AssessmentPatient is a 69 y.o. 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 Pt also seems stable on his Suboxone dose and not misusing his meds including his prescribed temazepam. Made aware of risks of taking a benzodiazepine given the opioid prescription and his age but will continue to monitor given his chronic hx of being prescribed a benzodiazepine. Also dealing with chronic back pain.The Self-Report [place] Risk Suicide Severity Rating Scale Since Last Visit was completed by Ramon Dredge and reviewed by myself today.  Additionally, risk assessment from Estanislado Pandy Blaschke's intake assessment and follow up screenings have been reviewed.  Based on Rumeal Cullipher Meddaugh?s clinical presentation today as well as his self-reported symptom ratings today and over the current course of treatment, I have assessed Ramon Dredge to be at unchanged risk of harm to self as compared with prior recent assessments.  Based on this assessment, I have continued current treatment plan including implementation of factors to mitigate risks of harm to self as outlined in the last risk assessmentIntervention(s)Medication ManagementLabs/Test/Consults OrderedToxicologyMedication ChangesNonePlan- Continue prescription of buprenorphine-naloxone 4-1mg  daily for opioid maintenance- Continue escitalopram 10mg  daily for depression- Continue bupropion XL 300mg  daily for depression- Continue [facility]- Return to see this MD in clinic in 3 weeksElectronically SignedProvider:  Harlene Ramus, M.D.10/5/202111:45 AM

## 2020-07-27 ENCOUNTER — Inpatient Hospital Stay: Admit: 2020-07-27 | Discharge: 2020-07-27 | Payer: MEDICARE | Primary: Internal Medicine

## 2020-07-27 LAB — URINE DRUG SCREEN, MEDTOX     (GH)
BKR AMPHETAMINE: NEGATIVE
BKR BARBITURATES: NEGATIVE
BKR BENZODIAZEPINES: NEGATIVE
BKR BUPRENORPHINE: POSITIVE — AB
BKR COCAINE (BENZOYLECGONINE): NEGATIVE
BKR METHADONE: NEGATIVE
BKR METHAMPHETAMINE: NEGATIVE
BKR OPIATES: NEGATIVE ng/mL
BKR OXYCODONE: NEGATIVE
BKR PCP, PHENCYCLIDINE: NEGATIVE
BKR PROPOXYPHENE: NEGATIVE
BKR TCA, TRICYCLIC ANTIDEPRESSANTS: NEGATIVE
BKR THC, CANNABINOIDS: NEGATIVE

## 2020-07-28 ENCOUNTER — Encounter: Admit: 2020-07-28 | Payer: PRIVATE HEALTH INSURANCE | Attending: Internal Medicine | Primary: Internal Medicine

## 2020-07-28 ENCOUNTER — Inpatient Hospital Stay: Admit: 2020-07-28 | Discharge: 2020-07-28 | Payer: MEDICARE | Primary: Internal Medicine

## 2020-07-28 DIAGNOSIS — M549 Dorsalgia, unspecified: Secondary | ICD-10-CM

## 2020-07-28 NOTE — Group Note
Group Session:  Alcohol/Substance Abuse RecoveryGroup Start Time:    9:00 AM      End Time:  10:30 AMFacilitators:  Jeanella Cara, LCSWSession Focus Relapse prevention Number of Participants 3 Treatment Modality Group Therapy Interventions/Education Relapse Prevention InterventionsEncouraged patient to pursue a healthy lifestyleHelped patient become skilled in recognizing/process/coping with thoughts/feelingsHelped patient identify relapse prevention strategiesIdentified and deactivated triggers to drug/alcohol useIdentified alternative activities for social connectionIdentified alternative activities to structure leisure timeSubstance Abuse Recovery InterventionsEncouraged processing of thoughts/feelings/actions related to substance abuseEncouraged patient to identify adaptive ways of coping with negative feeling statesIdentified social supports Attendance            AttendedEngagement InterventionsNot applicable - patient attended group without difficulty Degree of Participation Active Behavior Alert and Appropriate Speech/Thought Process Coherent and Organized Affect/Mood Full range and Stable Insight Good Judgment Fair Response to Interventions Engaged, Interested and Receptive Individualization TELEHEALTH VISIT:  This clinician is part of the telehealth program and is conducting this visit in a currently approved location.-  For this visit the clinician and patient were present via:   interactive audio and video telecommunications system that permits real-time communications-  Consent was signed via the Patient Acknowledgement and Financial Authorization Form and the Ambulatory Telehealth Consent Form-  State patient is located in: [place]-  The clinician is appropriately licensed in the above state to provide care for this visit.-  Clinician is physically located on site-  Other individuals present during the telehealth encounter and their role/relation: group peers, program RN Participants checked in about status of abstinence, sharing about any stressors, relapse triggers, cravings or urges they contended with since last session and how they dealt with these. Group discussion focused on coping with life stressors on life?s terms and the importance of practicing both physical and emotional self-care.Pt reported continued abstinence.He shared that he continues to struggle with back pain, saying he had an x-ray yesterday. Pt said that the x-ray appears to have ruled out any fractures and that the issue may be related to a muscle injury as opposed to a bone injury. Pt said he will follow up with his MD (Dr. Lenis Noon) tomorrow for further exploration of his pain, wondering aloud whether things like massage therapy or PT might be helpful.Pt said that in the past, he would have turned to substances to anesthetize himself, ?but now I just try to plow through!? He observed that he feels that having a ?why me? or victim mindset ?is not the answer? when facing difficult situations and that he instead tries to focus on accomplishing something each day - rather than dwelling on pain/discomfort or giving in to negative thinking.Pt said he stays occupied by doing things like getting out of the house to go to [place], the store, etc. He noted that he attended a Zoom meeting last night for people with medical issues and that he plans to attend a virtual prayer meeting through his [place] tomorrow night. Pt added that he and wife plan to participate in a virtual open mic night through their [place] on Saturday.Pt said that when he feels better, he would like to be able to go visit his daughter in [place]. He said that he is also interested in getting back into volunteering for [employer], although is uncertain about how COVID-related restrictions might be a barrier to this. Marland KitchenHurshel Keys - Suicide Severity Screen Most Recent Value Have you wished you were dead or wished you could go to sleep and not wake up? no Have you actually  had any thoughts of killing yourself? no Have you ever done anything, started to do anything, or prepared to do anything to end your life? no [place] Suicide Risk Level Low Risk   Plan Continue to assist patient with relapse preventionContinue per Plan of Care / Interdisciplinary Plan of Care

## 2020-07-29 LAB — ALCOHOL METABOLITES, QUANT, URINE     (BH GH)
ETHYL GLUCURONIDE: NEGATIVE ng/mL (ref <500)
ETHYL SULFATE: NEGATIVE ng/mL (ref <100)

## 2020-07-29 LAB — NOTES AND COMMENTS

## 2020-08-03 ENCOUNTER — Inpatient Hospital Stay: Admit: 2020-08-03 | Discharge: 2020-08-03 | Payer: MEDICARE | Primary: Internal Medicine

## 2020-08-03 DIAGNOSIS — M545 Low back pain, unspecified: Secondary | ICD-10-CM

## 2020-08-03 DIAGNOSIS — F3340 Major depressive disorder, recurrent, in remission, unspecified: Secondary | ICD-10-CM

## 2020-08-03 NOTE — Group Note
Group Session:  Alcohol/Substance Abuse RecoveryGroup Start Time:    9:00 AM      End Time:  10:30 AMFacilitators:  Renato Gails, LADC Group Start Time:    9:00 AM      End Time:  10:30 AMFacilitators:  Renato Gails, LADC TELEHEALTH VISIT:  This clinician is part of the telehealth program and is conducting this visit in a currently approved location.-  For this visit the clinician and patient were present via:   interactive audio and video telecommunications system that permits real-time communications-  Consent was signed via the Patient Acknowledgement and Financial Authorization Form and the Ambulatory Telehealth Consent Form-  State patient is located in: [place]-  The clinician is appropriately licensed in the above state to provide care for this visit.-  Clinician is physically located on site-  Other individuals present during the telehealth encounter and their role/relation: other group membersSession Focus Relapse preventionGroup members reviewed their week, identified coping skill used to stay sober and gave recovery oriented feedback to peersGroup discussed concept of balance in recovery Number of Participants 4 Treatment Modality Group Therapy Interventions/Education Relapse Prevention InterventionsHelped patient become skilled in recognizing/process/coping with thoughts/feelingsIdentified positive outcomes for refraining from substancesSubstance Abuse Recovery InterventionsEncouraged patient to make positive decisionsIdentified connection between feelings, thoughts and behaviorsIdentified healthy leisure activities to help distract urgesIdentified positive traits to build self esteemIncreased the ability for self-observation Attendance            AttendedEngagement InterventionsNot applicable - patient attended group without difficulty Degree of Participation Minimal Behavior Appropriate Speech/Thought Process Focused Affect/Mood Full range Insight Moderate Judgment Moderate Response to Interventions Attentive Individualization Pt reports continued sobriety; pt states that he has been dx with a compression fracture last week; states he was in pain from it for the last 2-3 weeks; feels that he has been dealing with a lot of health issues recently which is discouraging; afraid that if he gives into his pain and other health issues he will feel more depressed; going to church and doing groceries because his wife does not drive; states that the support he gets from the group is critical for his mental healthColumb[place] - Suicide Severity Screen    Most Recent Value Have you wished you were dead or wished you could go to sleep and not wake up? no Have you actually had any thoughts of killing yourself? no Have you ever done anything, started to do anything, or prepared to do anything to end your life? no [place] Suicide Risk Level Low Risk  ' Plan Continue to assist patient with relapse prevention

## 2020-08-10 ENCOUNTER — Inpatient Hospital Stay: Admit: 2020-08-10 | Discharge: 2020-08-10 | Payer: MEDICARE | Primary: Internal Medicine

## 2020-08-10 DIAGNOSIS — M545 Low back pain, unspecified: Secondary | ICD-10-CM

## 2020-08-10 DIAGNOSIS — F1121 Opioid dependence, in remission: Secondary | ICD-10-CM

## 2020-08-10 NOTE — Group Note
Group Session:  Alcohol/Substance Abuse RecoveryGroup Start Time:    9:00 AM      End Time:  10:30 AMFacilitators:  Renato Gails, LADC Group Start Time:    9:00 AM      End Time:  10:30 AMFacilitators:  Renato Gails, LADC TELEHEALTH VISIT:  This clinician is part of the telehealth program and is conducting this visit in a currently approved location.-  For this visit the clinician and patient were present via:   interactive audio and video telecommunications system that permits real-time communications-  Consent was signed via the Patient Acknowledgement and Financial Authorization Form and the Ambulatory Telehealth Consent Form-  State patient is located in: -  The clinician is appropriately licensed in the above state to provide care for this visit.-  Clinician is physically located on site-  Other individuals present during the telehealth encounter and their role/relation:other group membersSession Focus Relapse preventionGroup reviewed their week, gave recovery oriented feedback to peers Pts discussed Hope and what gives them hope in recovery Number of Participants 5 Treatment Modality Group Therapy Interventions/Education Relapse Prevention InterventionsIdentified alternative activities for social connectionSubstance Abuse Recovery InterventionsEncouraged processing of thoughts/feelings/actions related to substance abuseEncouraged patient to make positive decisionsIdentified connection between feelings, thoughts and behaviorsIdentified healthy leisure activities to help distract urgesIdentified positive traits to build self esteemIdentified social supportsIncreased the ability for self-observation Attendance            AttendedEngagement InterventionsNot applicable - patient attended group without difficulty Degree of Participation Moderate Behavior Appropriate Speech/Thought Process Focused Affect/Mood Euthymic Insight Good Judgment Moderate Response to Interventions Attentive Individualization Pt reports increasing his suboxone dose  a little to help manage pain from compression fracture as advised by Dr Lenis Noon; states that he is having bone density tests done; discouraged by his health challenges, maintaining hope through his faith and wanting to be a positive impact on his children's and  other's lives; states that the idea that what he gives out will come back to him is hopeful[place] - Suicide Severity Screen    Most Recent Value Have you wished you were dead or wished you could go to sleep and not wake up? no Have you actually had any thoughts of killing yourself? no Have you ever done anything, started to do anything, or prepared to do anything to end your life? no [place] Suicide Risk Level Low Risk   Plan Continue to assist patient with relapse prevention

## 2020-08-11 ENCOUNTER — Encounter: Admit: 2020-08-11 | Payer: PRIVATE HEALTH INSURANCE | Attending: Internal Medicine | Primary: Internal Medicine

## 2020-08-11 ENCOUNTER — Encounter: Admit: 2020-08-11 | Payer: PRIVATE HEALTH INSURANCE | Attending: Interventional Cardiology | Primary: Internal Medicine

## 2020-08-11 DIAGNOSIS — S22000A Wedge compression fracture of unspecified thoracic vertebra, initial encounter for closed fracture: Secondary | ICD-10-CM

## 2020-08-12 ENCOUNTER — Inpatient Hospital Stay: Admit: 2020-08-12 | Discharge: 2020-08-12 | Payer: MEDICARE | Primary: Internal Medicine

## 2020-08-12 DIAGNOSIS — S22000A Wedge compression fracture of unspecified thoracic vertebra, initial encounter for closed fracture: Secondary | ICD-10-CM

## 2020-08-15 ENCOUNTER — Encounter: Admit: 2020-08-15 | Payer: PRIVATE HEALTH INSURANCE | Attending: Psychiatry | Primary: Internal Medicine

## 2020-08-15 NOTE — Telephone Encounter
Pt has called requesting a refill of Buprenorphine.  He used a few extra because he was in pain with a thoracic spine fracture.

## 2020-08-16 ENCOUNTER — Inpatient Hospital Stay: Admit: 2020-08-16 | Discharge: 2020-08-16 | Payer: MEDICARE | Primary: Internal Medicine

## 2020-08-16 ENCOUNTER — Encounter: Admit: 2020-08-16 | Payer: PRIVATE HEALTH INSURANCE | Primary: Internal Medicine

## 2020-08-16 DIAGNOSIS — F1121 Opioid dependence, in remission: Secondary | ICD-10-CM

## 2020-08-16 DIAGNOSIS — F3340 Major depressive disorder, recurrent, in remission, unspecified: Secondary | ICD-10-CM

## 2020-08-16 DIAGNOSIS — M545 Low back pain, unspecified: Secondary | ICD-10-CM

## 2020-08-16 MED ORDER — BUPRENORPHINE 4 MG-NALOXONE 1 MG SUBLINGUAL FILM
4-1 mg | ORAL_FILM | Freq: Every day | SUBLINGUAL | 1 refills | Status: AC
Start: 2020-08-16 — End: 2020-09-14

## 2020-08-16 MED ORDER — BUPRENORPHINE 2 MG-NALOXONE 0.5 MG SUBLINGUAL FILM
ORAL_FILM | Freq: Every day | SUBLINGUAL | 1 refills | Status: AC
Start: 2020-08-16 — End: ?

## 2020-08-16 NOTE — Progress Notes
[facility] [address]Green[address] [place] [address]MVHQI Number: [phone] Medication Management Psychiatry MD/LIP Progress Note10/26/2021Visit Start Time:  10:00AM      Visit End Time:  10:30AMAdditional time spent reviewing chart on day of visit: 15 minutesSession Type:  Medication ManagementTELEHEALTH VISIT:  This clinician is part of the telehealth program and is conducting this visit in a currently approved location.-  For this visit the clinician and patient were present via:   interactive audio and video telecommunications system that permits real-time communications-  Consent was signed via the Patient Acknowledgement and Financial Authorization Form and the Ambulatory Telehealth Consent Form-  State patient is located in: [place]-  The clinician is appropriately licensed in the above state to provide care for this visit.-  Clinician is physically located on site-  Other individuals present during the telehealth encounter and their role/relation: none-  Start time: 10:00AM      Stop time:  10:30AM     Total time spent in medical video consultation: 45 minutes          (Total time spent by the provider on the day of service, which includes time spent on chart review,            medical video consultation, education, coordination of care/services and counseling)-  Because this visit was completed over video, a hands-on physical exam was not performed.   Patient/parent or guardian understands and knows to call back if condition changes.Tyler Khan is a 69 y.o., Married, male.SUBJECTIVE Chief Complaint: Patient is a 69 y.o. 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 He also just had a bone density scan which showed osteoporosis. Pt has been having severe pain in his back and great difficulty ambulating. His primary MD Dr. Lenis Noon recommended OTC analgesics but they did not help. Dr. Lenis Noon also suggested increasing his Suboxone. Pt thus took additional doses of his Suboxone and ran out a few days early. Says he understands why he should have contacted me or had Dr. Lenis Noon reach out. Apologizes. We discuss how increasing the Suboxone does make sense and I agree but advised that he should have contacted me first and I would have been able to increase his dose them. Pt understands. It is agreed that since his pain is quite high that he will increase his Suboxone from 4-1 mg a day (although some days he took less before his injury) to 6-1.5 mg a day until his pain subsides. Will also coordinate with Dr. Lenis Noon.REVIEW OF ALLERGIES/MEDICATIONS/HISTORY I have reviewed the patient's current medications, allergies and past medical historyI have reviewed with the patient, the risks and benefits of the medications prescribed.OBJECTIVE Review of SystemsReview of Systems Constitutional: Positive for fatigue. Respiratory: Negative.  Cardiovascular: Negative.  Gastrointestinal: Negative.  Musculoskeletal: Positive for arthralgias, back pain and gait problem. Neurological: Positive for weakness. Psychiatric/Behavioral: Negative for behavioral problems, decreased concentration, dysphoric mood, sleep disturbance and suicidal ideas. The patient is not nervous/anxious.   LabsI have reviewed the patients lab results within the last 4 weeks.  Significant findings are + buprenorphine (prescribed)Current MedicationsCurrent Outpatient Medications Medication Sig Note ? acetaminophen (TYLENOL) 325 MG tablet Take 650 mg by mouth every 6 (six) hours as needed..  ? buprenorphine-naloxone (SUBOXONE) 2-0.5 mg per sublingual film Place 1 Film under the tongue daily.  ? buprenorphine-naloxone (SUBOXONE) 4-1 mg per  sublingual film Place 1 Film under the tongue daily.  ? buPROPion XL (WELLBUTRIN XL) 300 mg 24 hr tablet Take 1 tablet (300 mg total) by mouth every morning.  ? caffeine 200 mg Tab tablet Take 200 mg by mouth daily.  ? calcium carbonate (TUMS) *200 mg CALCIUM* chewable tablet Take 3 tablets by mouth daily.  ? chlordiazePOXIDE (LIBRIUM) 25 MG capsule Take 25 mg by mouth 2 (two) times daily as needed for Anxiety. (Patient not taking: Reported on 07/06/2020)  ? clobetasol (TEMOVATE) 0.05 % cream APPLY SPARINGLY TO AFFECTED AREA TWICE A DAY  ? co-enzyme Q-10 30 mg capsule Take 30 mg by mouth daily.  ? escitalopram oxalate (LEXAPRO) 10 mg tablet Take 1 tablet (10 mg total) by mouth every morning.  ? fish oil-omega-3 fatty acids 1,000 mg capsule Take 2 g by mouth daily.  ? furosemide (LASIX) 40 MG tablet TAKE 1 AND 1/2 TABLETS ORALLY DAILY AS DIRECTED 07/23/2016: Received from: External Pharmacy ? glucosamine-chondroitin 500-400 mg tablet Take 2 tablets by mouth daily.  ? MATZIM LA 360 mg LA 24 hr extended release tablet   ? mupirocin (BACTROBAN) 2 % ointment   ? tadalafil (CIALIS) 20 MG tablet Take 20 mg by mouth as needed for Erectile Dysfunction.  ? warfarin (COUMADIN) 6 MG tablet TAKE 1 TABLET BY MOUTH EVERY DAY  No current facility-administered medications for this encounter. Mental Status ExamGeneral AppearanceHabitus:  ThinGrooming:  GoodPsychiatricAttitude: Cooperative and good eye contactPsychomotor Behavior: No psychomotor activation or retardationSpeech: normal rate, volume and prosodyMood: Normal, calm and euthymic depressed, not anxious, not irritableAffect: Congruent to reported mood and appropriately reactive not anxious, dysphoric, constrictedThought Process: Coherent, logical, goal directedAssociations: NormalThought Content: NormalSuicidal Ideation: No current suicidal plan, ideation or intentHomicidal Ideation: No current homicidal ideation, plan or intentJudgment: Questionable.Insight:  FairCognitive EvaluationOrientation: Oriented to person, oriented to place and oriented to date/time Safety and Risk AssessmentRISK ASSESSMENT with C-SSRS and SAFE-TPSY RISK ASSESSMENT SAFE-T WITH C-SSRS  Reason for Assessment:  Routine Ambulatory Psychiatric AssessmentC-SSRS: Suicidal Ideation:  Since Last Visit  WISH TO BE DEAD:  No  CURRENT SUICIDAL THOUGHTS:  No  Have you ever done anything, started to do anything or prepared to do anything to end your life?:  NoRisk Assessment:   Access to Lethal Methods:  NoCurrent and Past Psychiatric Diagnoses:    Mood Disorder:  Recurrent/Current  Psychotic Disorder:  No  Alcohol/Substance Abuse Disorders:  Sustained Remission  PTSD:  No  ADHD:  Defer for Further Assessment  TBI:  No  Cluster B Personality Disorders or Traits:  No  Conduct Problems:  No  Suicide Attempt:  No Prior Attempts  Presenting Symptoms:  Anhedonia:  No  Impulsivity:  No  Hopelessness or Despair:  No  Anxiety and/or Panic:  No  Insomnia:  No  Command Hallucinations:  No  Psychosis:  No  Self-injurious Behavior:  No  Physical Aggression Toward Others:  No  Violence, Victimization and/or Perpetration:  No  Family History:   Suicide:  No  Suicidal Behavior:  No  Axis I Psychiatric Diagnosis requiring hospitalization:  No  Precipitants / Stressors:   Triggering events leading to humiliation, shame and/or despair:  No  Chronic physical pain or other acute medical problem:  Yes  Sexual / Physical Abuse:  No  Substance Intoxication or Withdrawal:  No  Pending Incarceration:  No  Legal Problems:  No  Inadequate Social Supports:  No  Social Isolation:  No  Perceived burden on others:  No  Bullying /  Cyberbullying:  No  Media portrayal of suicide:  No  Housing Insecurity:  No Financial Insecurity:  No  Stressful Life Events:  No  Gender / Sexual Identity:  No  Homelessness:  No  Change in Treatment:    Recent inpatient discharge:  No  Change in provider or treatment:  No  Hopeless or dissatisfied with provider or treatment:  No  Non-compliant:  No  Not receiving treatment:  NoCurrent and Past Psychiatric Diagnoses:  Mood Disorder:  Recurrent/Current  Psychotic Disorder:  No  Alcohol/Substance Abuse Disorders:  Sustained Remission  PTSD:  No  ADHD:  Defer for Further Assessment  TBI:  No  Cluster B Personality Disorders or Traits:  No  Conduct Problems:  No  Suicide Attempt:  No Prior AttemptsProtective Factors:   Internal:   Ability to cope with stress:  Yes  Frustration tolerance:  Yes  Religious beliefs:  Yes  Fear of death or the actual act of killing self:  Yes  Identifies reasons for living:  Yes  Problem solving skills:  Yes  External:   Cultural, spiritual and/or moral attitudes against suicide:  Yes  Responsibility to children:  Yes  Beloved pets:  No  Supportive social network of friends or family:  Yes  Positive therapeutic relationships / Effective mental healthcare:  Yes  Step 2 External - Engaged in work/school: n/a.Risk to Self - Self-Injurious Behavior:   Current Urges to harm Self:  No  Recent Self-Injury:  No  History of Self Injury:  No  Attitude Regarding Self Injury:  Ego dystonic  Imminent Risk for Self Injury in Community:  Low  Imminent Risk for Self Injury in Facility:  LowRisk to Others:   Current Agitation:  No  Homicidal/Aggressive Ideation:  No  Homicidal/Aggressive Threat/Plan:  No  Recent Violence/Aggression:  No  Attitude Regarding Aggression / Violence:  Ego dystonic  Imminent Risk for Violence in Community:  Low  Imminent Risk for Violence in Facility:  LowWithdrawal Risk:   Alcohol / Benzodiazepines / Barbiturates:  Daily use (prescribed librium) Alcohol/Benzodiazepine/Barbiturate Risk for Withdrawal:  Low  Opioids:  Daily use (prescribed suboxone)     Opioid Risk for Withdrawal:  Low?MEDICAL DECISION MAKING DiagnosisProblem List         ICD-10-CM   Y IOP/PHP THERAPY PLAN  Opioid use disorder, moderate, in sustained remission, on maintenance therapy, dependence (HC Code) (HC CODE) 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	Number and complexity of problems (Low, moderate, or high): Moderate2.	Amount and Complexity of data reviewed and analyzed (Low, moderate, or high): Low3.	Risk of Complications of morbidity and mortality of patient management (Low, moderate, or high): LowOverall Risk AssessmentModerate - one or more chronic illnesses with mild exacerbation, progression or side effect (includes prescription drug management)IMPRESSION & PLAN Formulation / AssessmentPatient is a 69 y.o. 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 That said, will need to coordinate with his outpatient medical providers to ensure this increase is only temporary and that pt is not misusing his buprenorphine.The Self-Report [place] Risk Suicide Severity Rating Scale Since Last Visit was completed by Tyler Khan and reviewed by myself today.  Additionally, risk assessment from Tyler Khan's intake assessment and follow up screenings have  been reviewed.  Based on Grayer Sproles Tarpley?s clinical presentation today as well as his self-reported symptom ratings today and over the current course of treatment, I have assessed Tyler Khan to be at unchanged risk of harm to self as compared with prior recent assessments.  Based on this assessment, I have continued current treatment plan including implementation of factors to mitigate risks of harm to self as outlined in the last risk assessmentIntervention(s)Medication ManagementLabs/Test/Consults OrderedToxicologyMedication ChangesIncrease buprenorphinePlan- Will increase dose of buprenorphine-naloxone to 6-1.5 mg a day (4-1mg   And 2-0.5 mg daily) for opioid maintenance with the increased dose due to back pain from spinal fracture- Continue escitalopram 10mg  daily for depression- Continue bupropion XL 300mg  daily for depression- Continue [facility]- Return to see this MD in 2 weeksElectronically SignedProvider:  Harlene Ramus, M.D.10/26/202110:35 AM

## 2020-08-16 NOTE — Other
Outpatient Interdisciplinary Treatment PlanJames Richard [id number]/26/2021Overview: Current Diagnosis:Problem List         ICD-10-CM   Y IOP/PHP THERAPY PLAN  * (Principal) Opioid use disorder, moderate, in sustained remission, on maintenance therapy, dependence (HC Code) (HC CODE) F11.21   INFUSION TREATMENT     Level of Care/Treatment Track: [facility] CCGFrequency: Once a weekEstimated duration/length of stay in the program: 3 monthsTreatment Modalities: Group Psychotherapy;Pharmacological ManagementPatient Strengths: expresses reasons to live/engage in productive activity;connection to faith community;sense of responsibility to family/others;able to define needs;willing to ask for help when symptoms exacerbate;positive therapeutic relationship with outpatient treatersPatient and/or Family Stated Short Term Personal Goal(s): to be happy...per ptPatient and/or Family Stated Long Term Personal Goal(s): to move to [place] and be closer to his daughter and grandson.. per ptGoal of Treatment #1: Relapse prevention and MATGoal of Treatment #2: increased sober supportGoal of Treatment #3: develop healthier coping skillsThe patient did participate in the development of this Treatment Plan.Active Multidisciplinary Problems: INTERDISCIPLINARY PROBLEM LISTSubstance Use / Abuse:  ActiveDescriptive Behaviors:  Pt has hx of opiate dependence and has been on MAT at [facility] since 2014; Pt rpeorts compliance with MAT and keeps scheduled appts with dr Helmut Muster Pt's UDS +ve for alcohol on 10/29/2018. Pt stated that he had drank alcohol over the holidays out of boredom ad sense of feeling deprived because of several limitations related to Crohn's disease. Pt understands the risks of continued alcohol use including cross addiction; states intent to not drink alcoholPt identifes loneliness and lack of fulfillment in his marriage as stressorsPt does Meals on Wheels, is ery involved with his church and has a supportive relationship with his daughter in [place] who recently has her first child.Pt's wife is going for family reunion in [place] pt will visit his daughter during that timePt used to attend [employer]; has not gone recently; pt continues to be encouraged to resume sober support groups5/4/2020Pt reports staying sober, denies cravings or thoughts of drinking alcohol,compliant with MAT, verbalizes frustration over being essentially confined to his house due to COVID 19 public health guidelines; identifies boredom as stressor; stays connected with [employer] and cingregation through [employer]; also facetimes regularly with his daughter in [place]; disappointed tat he had to cancel his trip to visit her and his grandson.Pt coping adaptively with his feelings; pt continues to be encouraged to attend sober support groups online; 7/30/2020Pt reports continued sobriety, compliance with MAT; articulates feelings of boredom and occasional isolation related to the limitations imposed by the pandemic, expresses coping as best as I can; presents as coping adaptively, taking care of tasks that he had been putting off, staying connected with his children and Church; pt continues to be encouraged to resume Haematologist meetings to increase sober support network10/5/2020Pt reports continued sobriety; compliant with MAT;identifies boredom as a trigger; pt has been working through low mood and lethargy; stated he was sick last 2 weeks, COVID test was negative; states he is feeling better, enjoys out doors in person [employer]; identifies it as a main support system; continues to be encouraged to attend sober support groups online or in person12/2020Pt reports continued sobriety, compliant with MAT, reports occasional depressed mood due to limitations/restrictions caused by pandemic;reports feeling more hopeful when he thinks of how close we are t getting a vaccine states that it does not last for more than a couple of days at a time; identifies going to church in person and meeting up with his friends there as uplifting; does  virtual bible study classes every Wednesdays, misses doing meals on wheels; states he stays busy getting things done around the house and feels good when he tackles small tasks like clearing the clutter from his table; enjoys zoom calls with his daughters; pt continues to be encouraged to attend sober support groups online3/2021Pt reports continued sobriety, compliant with MAT, increasing discouraged by limitations due to COVID19- hopeful of getting ting vaccinated soon; working through Therapist, sports; feels supported and well taken care of by PCP; identifies his faith and church activities as his main coping skills; enjoys face time with his daughter and grandson in [place]; pt continues to be reluctant to engage in online/i person sober support groups5/25/2021Pt is sober, reports compliance with MAT; attends CCG regularly; presents as more tangential, and more difficult to redirect; expresses increased anxiety around difficulty completing tasks; identifies procrastination as a huge challenge; discouraged in his marriage; feels close and supported by his daughter in [place] and his sister in [place]; client has been encouraged to attend sober support groups but it resistant to doing so; identifies Church as his main support system and his faith as his coping skill; 8/9/2021Pt reports continued sobriety, compliance with MAT; pt struggles with loneliness, low mood and energy morre frequently than in the past; makes effort o stay busy and structure his day;identifies face timing with daughter and grandson in [place] as a mood booster; identifies church as giving him sense of meaningfulness and belongingness10/26/2021Pt has been working through low mood and low energy in the last several weeks; he has been mostly confined to the house due to a compression fracture which has been very painful; discouraged occasionally by the multiple health challenges he has had to overcome since childhood; states he increased his suboxone does on the advice of Dr Lenis Noon to manage the pain; ran out of suboxone early and asked Dr Sharyon Cable for an early refill; pt was advised to have Dr Lenis Noon call Dr Sharyon Cable and discuss; pt's appt with Dr Sharyon Cable is being rescheduled for today to discuss treatment plan/cordinate care with Dr Wiliam Ke identifies church activities and his faith as what keeps him hopeful and optimistic;pt has been encouraged to attend virtual sober support groups but has not followed through. Pt states that he it takes a lot of determination for him to get his day to day tasks like grocery shopping done and that he does not have the energy to do more than the absolute minimum; pt identifies CCG as his main support system; states that since he is familiar with everyone on the group it is a comfortable and easyspace for him to connect withPatient Stated Goal(s):  To stay sober and be happy and have a sense of purpose...per ptLong Term Goal(s):  Medication compliance/adjustments, Prevent recurrence/exacerbation of symptoms and Caregiver engagement in discharge plan/follow-up treatment     Target Date:  1/2022Short Term Goal/Objective:  Patient will report a decrease in substance use as evidenced by ? self report, -ve UDS and breathalyzers     Target Date: 10/2020     Status:  Achieved and ongoingShort Term Goal/Objective:  Patient will identify/practice/demonstrate the use of adaptive coping skills as evidenced by ? verbalizing and demonstrating emotional regulation and stress management skills     Target Date: 10/2020     Status:  Partially achievedShort Term Goal/Objective:  Patient will increase their sober support network as evidenced by ? attending online sober support groups 1-2 x weekly     Target Date: 10/2020  Status:  Minimal progressIntervention/Frequency:  Encourage identification/practice/mastery of adaptive coping skills weekly (tele health)Intervention/Frequency:  Educate patients on the benefits of joining a support group and utilizing support systems weekly (tele health)Intervention/Frequency:  Encourage patient to Identify connection between feeling, thoughts and behaviors weekly (tele health)Other Assessed Need(s) (Medical/Psychosocial)(List any other assessed needs that were identified in the assessment of the patient that are NOT included in the Interdisciplinary Problem List noted above.  Indicate what the INTERVENTION, REFERRAL or DEFERRAL will be for each of the other Assessed Needs identified (e.g. name of PCP, other medical provider, therapist, community resource, etc.)Physical and/or medical problems:  pain frm compression fracture  Refer:  pt is working with his PCP to manage pain associated with compresison fractureMedical / Psychological Assessments: Medications:Assessment of medication side effects and/or adverse medication reactions is ongoing     Current Outpatient Medications Medication Sig Note ? acetaminophen (TYLENOL) 325 MG tablet Take 650 mg by mouth every 6 (six) hours as needed..  ? buprenorphine-naloxone (SUBOXONE) 4-1 mg per sublingual film Place 1 Film under the tongue daily.  ? buPROPion XL (WELLBUTRIN XL) 300 mg 24 hr tablet Take 1 tablet (300 mg total) by mouth every morning.  ? caffeine 200 mg Tab tablet Take 200 mg by mouth daily.  ? calcium carbonate (TUMS) *200 mg CALCIUM* chewable tablet Take 3 tablets by mouth daily.  ? chlordiazePOXIDE (LIBRIUM) 25 MG capsule Take 25 mg by mouth 2 (two) times daily as needed for Anxiety. (Patient not taking: Reported on 07/06/2020)  ? clobetasol (TEMOVATE) 0.05 % cream APPLY SPARINGLY TO AFFECTED AREA TWICE A DAY ? co-enzyme Q-10 30 mg capsule Take 30 mg by mouth daily.  ? escitalopram oxalate (LEXAPRO) 10 mg tablet Take 1 tablet (10 mg total) by mouth every morning.  ? fish oil-omega-3 fatty acids 1,000 mg capsule Take 2 g by mouth daily.  ? furosemide (LASIX) 40 MG tablet TAKE 1 AND 1/2 TABLETS ORALLY DAILY AS DIRECTED 07/23/2016: Received from: External Pharmacy ? glucosamine-chondroitin 500-400 mg tablet Take 2 tablets by mouth daily.  ? MATZIM LA 360 mg LA 24 hr extended release tablet   ? mupirocin (BACTROBAN) 2 % ointment   ? tadalafil (CIALIS) 20 MG tablet Take 20 mg by mouth as needed for Erectile Dysfunction.  ? warfarin (COUMADIN) 6 MG tablet TAKE 1 TABLET BY MOUTH EVERY DAY  No current facility-administered medications for this encounter. Consult / Referral Made:No Consults or Referrals made at this timeDischarge Planning: Anticipated Discharge Date: 3 monthsAnticipated Discharge Level of Care/ Disposition:Outpatient TherapyPlan: Pt will stay sober, stay compliant with MAT,  increase sober support, attend CCG weekly and develop healthier coping skills

## 2020-08-17 ENCOUNTER — Inpatient Hospital Stay: Admit: 2020-08-17 | Discharge: 2020-08-17 | Payer: MEDICARE | Primary: Internal Medicine

## 2020-08-17 DIAGNOSIS — F3340 Major depressive disorder, recurrent, in remission, unspecified: Secondary | ICD-10-CM

## 2020-08-17 DIAGNOSIS — F1121 Opioid dependence, in remission: Secondary | ICD-10-CM

## 2020-08-17 NOTE — Telephone Encounter
Spoke with Dr. Lenis Noon, the patient's primary care provider, who has known Tyler Khan for 30+ yrs. 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 Was previously working with a doctor who was giving the pt pain meds for his Crohn's but the pt was misusing. Says that he told pt he could not and would not give him pain meds any longer. He feels pt has been stable on Suboxone and agreed that his taking a higher dose for the next 1-2 months may be the best option to manage the pain from his spinal fracture. Says pt had minimal response to NSAID diclofenac. Agrees to work w/ me on monitoring the pain relief and when he can go back to his lower dose.Will remain in touch to coordinate care.

## 2020-08-17 NOTE — Group Note
Group Session:  Alcohol/Substance Abuse RecoveryGroup Start Time:    9:00 AM      End Time:  10:30 AMFacilitators:  Renato Gails, LADCTELEHEALTH VISIT:  This clinician is part of the telehealth program and is conducting this visit in a currently approved location.-  For this visit the clinician and patient were present via:   interactive audio and video telecommunications system that permits real-time communications-  Consent was signed via the Patient Acknowledgement and Financial Authorization Form and the Ambulatory Telehealth Consent Form-  State patient is located in: [place]-  The clinician is appropriately licensed in the above state to provide care for this visit.-  Clinician is physically located on site-  Other individuals present during the telehealth encounter and their role/relation: other group membersSession Focus Relapse prevention Number of Participants 6 Treatment Modality Group Therapy Interventions/Education Relapse Prevention InterventionsList of personal relapse warning signs/strategies for coping with triggersEducated clients of the benefits of joining a support groupHelped patient become skilled in recognizing/process/coping with thoughts/feelingsHelped patient draft a relapse prevention planHelped patient identify relapse prevention strategiesIdentified thoughts/feelings that lead to relapseSubstance Abuse Recovery InterventionsEncouraged patient to make positive decisionsHelped patient identify positives/negatives of substance abuseHelped patient identify factors contributing to drug/alcohol useIdentified connection between feelings, thoughts and behaviorsIdentified healthy leisure activities to help distract urgesIdentified positive traits to build self esteemIdentified social supportsIncreased the ability for self-observationVerbally identified when impulsive actions led to abuse/negative consequences Attendance            AttendedEngagement InterventionsNot applicable - patient attended group without difficulty Degree of Participation Moderate Behavior Appropriate Speech/Thought Process Focused Affect/Mood Full range Insight Good Judgment Good Response to Interventions Attentive Individualization Pt states that he has been continuing to struggle with back pain; grateful that he was able to get his suboxone refilled because it helps him manage his pain a little; states that he has been going to church and is involved with the trunk or treating (at church) which helps him feel like he is contributing; [place] - Suicide Severity Screen    Most Recent Value Have you wished you were dead or wished you could go to sleep and not wake up? no Have you actually had any thoughts of killing yourself? no Have you ever done anything, started to do anything, or prepared to do anything to end your life? no [place] Suicide Risk Level Low Risk   Plan Continue to assist patient with relapse prevention

## 2020-08-21 DIAGNOSIS — F1121 Opioid dependence, in remission: Secondary | ICD-10-CM

## 2020-08-21 DIAGNOSIS — Z79899 Other long term (current) drug therapy: Secondary | ICD-10-CM

## 2020-08-21 DIAGNOSIS — F419 Anxiety disorder, unspecified: Secondary | ICD-10-CM

## 2020-08-21 DIAGNOSIS — F3340 Major depressive disorder, recurrent, in remission, unspecified: Secondary | ICD-10-CM

## 2020-08-21 DIAGNOSIS — M545 Low back pain, unspecified: Secondary | ICD-10-CM

## 2020-08-24 ENCOUNTER — Inpatient Hospital Stay: Admit: 2020-08-24 | Discharge: 2020-08-24 | Payer: MEDICARE | Primary: Internal Medicine

## 2020-08-24 DIAGNOSIS — F3340 Major depressive disorder, recurrent, in remission, unspecified: Secondary | ICD-10-CM

## 2020-08-24 NOTE — Group Note
Group Session:  Alcohol/Substance Abuse RecoveryGroup Start Time:    9:00 AM      End Time:  10:30 AMFacilitators:  Renato Gails, LADC TELEHEALTH VISIT:  This clinician is part of the telehealth program and is conducting this visit in a currently approved location.-  For this visit the clinician and patient were present via:   interactive audio and video telecommunications system that permits real-time communications-  Consent was signed via the Patient Acknowledgement and Financial Authorization Form and the Ambulatory Telehealth Consent Form-  State patient is located in: [place]-  The clinician is appropriately licensed in the above state to provide care for this visit.-  Clinician is physically located on site-  Other individuals present during the telehealth encounter and their role/relation: other group membersSession Focus Relapse prevention Number of Participants 5 Treatment Modality Group Therapy Interventions/Education Relapse Prevention InterventionsEducated clients of the benefits of joining a support groupHelped patient become skilled in recognizing/process/coping with thoughts/feelingsIdentified positive outcomes for refraining from substancesVerbalized details of the circumstances that led to the most recent relapseIncreased knowledge of addiction and process of recovery Attendance            AttendedEngagement InterventionsNot applicable - patient attended group without difficulty Degree of Participation Active Behavior Appropriate Speech/Thought Process Focused Affect/Mood Full range Insight Good Judgment Good Response to Interventions Attentive Individualization [place] - Suicide Severity Screen    Most Recent Value Have you wished you were dead or wished you could go to sleep and not wake up? no Have you actually had any thoughts of killing yourself? no Have you ever done anything, started to do anything, or prepared to do anything to end your life? no [place] Suicide Risk Level Low Risk   Plan Continue to assist patient with relapse prevention

## 2020-08-31 ENCOUNTER — Inpatient Hospital Stay: Admit: 2020-08-31 | Discharge: 2020-08-31 | Payer: MEDICARE | Primary: Internal Medicine

## 2020-08-31 NOTE — Group Note
Group Session:  Alcohol/Substance Abuse RecoveryGroup Start Time:    9:00 AM      End Time:  10:30 AMFacilitators:  Renato Gails, LADC TELEHEALTH VISIT:  This clinician is part of the telehealth program and is conducting this visit in a currently approved location.-  For this visit the clinician and patient were present via:   interactive audio and video telecommunications system that permits real-time communications-  Consent was signed via the Patient Acknowledgement and Financial Authorization Form and the Ambulatory Telehealth Consent Form-  State patient is located in: [place]-  The clinician is appropriately licensed in the above state to provide care for this visit.-  Clinician is physically located on siteSession Focus Relapse preventionPt reviewed their week and identified coping skills used to stay soberGroup dicussed grief and loss-stages of grief and meaning attribution Number of Participants 5 Treatment Modality Group Therapy Interventions/Education Relapse Prevention InterventionsEducated clients of the benefits of joining a support groupHelped patient become skilled in recognizing/process/coping with thoughts/feelingsHelped patient draft a relapse prevention planIdentified positive outcomes for refraining from substancesIdentified thoughts/feelings that lead to relapse Attendance            AttendedEngagement InterventionsNot applicable - patient attended group without difficulty Degree of Participation Moderate Behavior Appropriate Speech/Thought Process Focused Affect/Mood Full range Insight Moderate Judgment Moderate Response to Interventions Attentive Individualization [place] - Suicide Severity Screen    Most Recent Value Have you wished you were dead or wished you could go to sleep and not wake up? no Have you actually had any thoughts of killing yourself? no Have you ever done anything, started to do anything, or prepared to do anything to end your life? no [place] Suicide Risk Level Low Risk  Pt supportive of peers; feeling better but the healing is slow; finds church and church activities as helping him stay motivated to be active; in touch with his daughter in [place]; sharing in the cost of putting his grandson into a special school because he is developmentally delayed Plan Continue to assist patient with relapse prevention

## 2020-09-01 ENCOUNTER — Encounter: Admit: 2020-09-01 | Payer: PRIVATE HEALTH INSURANCE | Attending: Psychiatry | Primary: Internal Medicine

## 2020-09-01 MED ORDER — ESCITALOPRAM 10 MG TABLET
10 mg | ORAL_TABLET | Freq: Every morning | ORAL | 2 refills | Status: AC
Start: 2020-09-01 — End: 2020-11-01

## 2020-09-07 ENCOUNTER — Inpatient Hospital Stay: Admit: 2020-09-07 | Discharge: 2020-09-07 | Payer: MEDICARE | Primary: Internal Medicine

## 2020-09-07 DIAGNOSIS — F3340 Major depressive disorder, recurrent, in remission, unspecified: Secondary | ICD-10-CM

## 2020-09-07 NOTE — Group Note
Group Session:  Alcohol/Substance Abuse RecoveryGroup Start Time:    9:00 AM      End Time:  10:30 AMFacilitators:  Renato Gails, LADC TELEHEALTH VISIT:  This clinician is part of the telehealth program and is conducting this visit in a currently approved location.-  For this visit the clinician and patient were present via:   interactive audio and video telecommunications system that permits real-time communications-  Consent was signed via the Patient Acknowledgement and Financial Authorization Form and the Ambulatory Telehealth Consent Form-  State patient is located in: [place]-  The clinician is appropriately licensed in the above state to provide care for this visit.-  Clinician is physically located on site-  Other individuals present during the telehealth encounter and their role/relation: other group membersSession Focus Relapse preventionClients reviewed their week; identified stressors and coping skills used to stay sober; offered support to peer who is working through health concerns Number of Participants 6 Treatment Modality Group Therapy Interventions/Education Relapse Prevention InterventionsEducated clients of the benefits of joining a support groupIdentified alternative activities for social connectionIdentified alternative activities to structure leisure timeIdentified positive outcomes for refraining from substancesIdentified thoughts/feelings that lead to relapseIncreased knowledge of addiction and process of recovery Attendance            AttendedEngagement InterventionsNot applicable - patient attended group without difficulty Degree of Participation Active Behavior Appropriate Speech/Thought Process Focused Affect/Mood Full range Insight Moderate Judgment Good Response to Interventions Attentive Individualization [place] - Suicide Severity Screen    Most Recent Value Have you wished you were dead or wished you could go to sleep and not wake up? no Have you actually had any thoughts of killing yourself? no Have you ever done anything, started to do anything, or prepared to do anything to end your life? no [place] Suicide Risk Level Low Risk  pt reports feeling better,les pain; sounds more hopeful and positive; looking forward to Thanksgiving with his sister and extened family in [place] next week;  Plan Continue to assist patient with relapse prevention

## 2020-09-12 ENCOUNTER — Encounter: Admit: 2020-09-12 | Payer: PRIVATE HEALTH INSURANCE | Attending: Psychiatry | Primary: Internal Medicine

## 2020-09-12 NOTE — Telephone Encounter
Pt is requesting Buprenorphine 2 mg x 15 days as well as 4 mg x 30 days for back fracture.

## 2020-09-14 ENCOUNTER — Inpatient Hospital Stay: Admit: 2020-09-14 | Discharge: 2020-09-14 | Payer: MEDICARE | Primary: Internal Medicine

## 2020-09-14 DIAGNOSIS — F3340 Major depressive disorder, recurrent, in remission, unspecified: Secondary | ICD-10-CM

## 2020-09-14 MED ORDER — BUPRENORPHINE 4 MG-NALOXONE 1 MG SUBLINGUAL FILM
4-1 mg | ORAL_FILM | Freq: Every day | SUBLINGUAL | 1 refills | Status: AC
Start: 2020-09-14 — End: 2020-10-12

## 2020-09-14 MED ORDER — BUPRENORPHINE 2 MG-NALOXONE 0.5 MG SUBLINGUAL FILM
2-0.5 mg | ORAL_FILM | Freq: Every day | SUBLINGUAL | 1 refills | Status: AC
Start: 2020-09-14 — End: 2020-10-12

## 2020-09-14 NOTE — Progress Notes
[facility] [address]Green[address] [place] [address]UEAVW Number: [phone] Medication Management Psychiatry MD/LIP Progress Note11/24/2021TELEHEALTH MD/LIP: This clinician is part of the telehealth program and is conducting this visit in a currently approved location.-  For this visit the clinician and patient were present via:   interactive audio and video telecommunications system that permits real-time communications-  Consent was signed via the Patient Acknowledgement and Financial Authorization Form and the Ambulatory Telehealth Consent Form-  State patient is located in: [place]-  The clinician is appropriately licensed in the above state to provide care for this visit.-  Clinician is physically located on site-  Other individuals present during the telehealth encounter and their role/relation: none-  Start time: 03:00PM      Stop time:  03:18PM     Total time spent in medical video consultation: 32 minutes          (Total time spent by the provider on the day of service, which includes time spent on chart review, medical video consultation, education, coordination of care/services and counseling)-  Because this visit was completed over video, a hands-on physical exam was not performed.   Patient/parent or guardian understands and knows to call back if condition changes.Session Type:  Medication ManagementJames Ramzi Khan is a 69 y.o., Married, male.SUBJECTIVE Chief Complaint: Patient is a 69 y.o. 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 Hopes that the pain will be better by Jan 2022 so he can go back to his original maintenance dose. Says he is limiting his walking due to the pain and only goes out once a day at most and is not doing any heavy lifting. Pain is around 3/10 now and dull. Mood has been fine and he is trying to stay in good spirits. Will be going to [facility] w/ family for Thanksgiving. Otherwise, pt is well. Mood has been the same. No other physical complaints.REVIEW OF ALLERGIES/MEDICATIONS/HISTORY I have reviewed the patient's current medications, allergies and past medical historyI have reviewed with the patient, the risks and benefits of the medications prescribed.OBJECTIVE Review of SystemsReview of Systems Constitutional: Positive for fatigue. Respiratory: Negative.  Cardiovascular: Negative.  Gastrointestinal: Negative.  Musculoskeletal: Positive for arthralgias, back pain and gait problem. Neurological: Positive for weakness. Psychiatric/Behavioral: Positive for sleep disturbance. Negative for agitation, confusion, dysphoric mood, hallucinations and suicidal ideas. The patient is not nervous/anxious.   LabsNo new labsCurrent MedicationsCurrent Outpatient Medications Medication Sig Note ? acetaminophen (TYLENOL) 325 MG tablet Take 650 mg by mouth every 6 (six) hours as needed..  ? buprenorphine-naloxone (SUBOXONE) 2-0.5 mg per sublingual film Place 1 Film under the tongue daily.  ? buprenorphine-naloxone (SUBOXONE) 4-1 mg per sublingual film Place 1 Film under the tongue daily.  ? buPROPion XL (WELLBUTRIN XL) 300 mg 24 hr tablet Take 1 tablet (300 mg total) by mouth every morning.  ? caffeine 200 mg Tab tablet Take 200 mg by mouth daily.  ? calcium carbonate (TUMS) *200 mg CALCIUM* chewable tablet Take 3 tablets by mouth daily.  ? chlordiazePOXIDE (LIBRIUM) 25 MG capsule Take 25 mg by mouth 2 (two) times daily as needed for Anxiety. (Patient not taking: Reported on 07/06/2020)  ? clobetasol (TEMOVATE) 0.05 % cream APPLY  SPARINGLY TO AFFECTED AREA TWICE A DAY  ? co-enzyme Q-10 30 mg capsule Take 30 mg by mouth daily.  ? escitalopram oxalate (LEXAPRO) 10 mg tablet Take 1 tablet (10 mg total) by mouth every morning.  ? fish oil-omega-3 fatty acids 1,000 mg capsule Take 2 g by mouth daily.  ? furosemide (LASIX) 40 MG tablet TAKE 1 AND 1/2 TABLETS ORALLY DAILY AS DIRECTED 07/23/2016: Received from: External Pharmacy ? glucosamine-chondroitin 500-400 mg tablet Take 2 tablets by mouth daily.  ? MATZIM LA 360 mg LA 24 hr extended release tablet   ? mupirocin (BACTROBAN) 2 % ointment   ? tadalafil (CIALIS) 20 MG tablet Take 20 mg by mouth as needed for Erectile Dysfunction.  ? warfarin (COUMADIN) 6 MG tablet TAKE 1 TABLET BY MOUTH EVERY DAY  No current facility-administered medications for this encounter. Mental Status ExamGeneral AppearanceHabitus:  ThinGrooming:  GoodPsychiatricAttitude: Cooperative, pleasant and good eye contactPsychomotor Behavior: No psychomotor activation or retardationSpeech: normal rate, volume and prosodyMood: Normal not depressed, not anxiousAffect: Congruent to reported mood, full range and appropriately reactiveThought Process: Coherent, logical, goal directedAssociations: NormalThought Content: NormalSuicidal Ideation: No current suicidal plan, ideation or intentHomicidal Ideation: No current homicidal ideation, plan or intentJudgment:  GoodInsight:  FairCognitive EvaluationOrientation: Oriented to person, oriented to place and oriented to date/time Safety and Risk AssessmentRISK ASSESSMENT with C-SSRS and SAFE-TPSY RISK ASSESSMENT SAFE-T WITH C-SSRS??Reason for Assessment:??Routine Ambulatory Psychiatric AssessmentC-SSRS:?Suicidal Ideation:??Since Last Visit??WISH TO BE DEAD: ?No??CURRENT SUICIDAL THOUGHTS: ?No??Have you ever done anything, started to do anything or prepared to do anything to end your life?: ?NoRisk Assessment:???Access to Lethal Methods: ?NoCurrent and Past Psychiatric Diagnoses: ???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??Psychotic Disorder: ?No??Alcohol/Substance Abuse Disorders: ?Sustained Remission??PTSD: ?No??ADHD: ?Defer for Further Assessment??TBI: ?No??Cluster B Personality Disorders or Traits: ?No??Conduct Problems: ?No??Suicide Attempt: ?No Prior Attempts??Presenting Symptoms:??Anhedonia: ?No??Impulsivity: ?No??Hopelessness or Despair: ?No??Anxiety and/or Panic: ?No??Insomnia: ?No??Command Hallucinations: ?No??Psychosis: ?No??Self-injurious Behavior: ?No??Physical Aggression Toward Others: ?No??Violence, Victimization and/or Perpetration: ?No??Family History:???Suicide: ?No??Suicidal Behavior: ?No??Axis I Psychiatric Diagnosis requiring hospitalization: ?No??Precipitants / Stressors: ??Triggering events leading to humiliation, shame and/or despair: ?No??Chronic physical pain or other acute medical problem: ?Yes??Sexual / Physical Abuse: ?No??Substance Intoxication or Withdrawal: ?No??Pending Incarceration: ?No??Legal Problems: ?No??Inadequate Social Supports: ?No??Social Isolation: ?No??Perceived burden on others: ?No??Bullying / Cyberbullying: ?No??Media portrayal of suicide: ?No??Housing Insecurity: ?No??Financial Insecurity: ?No??Stressful Life Events: ?No??Gender / Sexual Identity: ?No??Homelessness: ?No??Change in Treatment: ???Recent inpatient discharge: ?No??Change in provider or treatment: ?No??Hopeless or dissatisfied with provider or treatment: ?No??Non-compliant: ?No??Not receiving treatment: ?NoCurrent and Past Psychiatric Diagnoses:??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Yes??Beloved pets: ?No??Supportive social network of friends or family: ?Yes??Positive therapeutic relationships / Effective mental healthcare: ?Yes??Step 2 External - Engaged in work/school:?n/a.Risk to Self - Self-Injurious Behavior: ??Current Urges to harm Self: ?No??Recent Self-Injury: ?No??History of Self Injury: ?No??Attitude Regarding Self Injury: ?Ego dystonic??Imminent Risk for Self Injury in Community: ?Low??Imminent Risk for Self Injury in Facility: ?LowRisk to Others: ??Current Agitation: ?No??Homicidal/Aggressive Ideation: ?No??Homicidal/Aggressive Threat/Plan: ?No??Recent Violence/Aggression: ?No??Attitude Regarding Aggression / Violence: ?Ego dystonic??Imminent Risk for Violence in Community: ?Low??Imminent Risk for Violence in Facility: ?LowWithdrawal Risk:???Alcohol / Benzodiazepines / Barbiturates: ?Daily use?(prescribed librium)?????Alcohol/Benzodiazepine/Barbiturate Risk for Withdrawal: ?Low??Opioids: ?Daily use?(prescribed suboxone)?????Opioid Risk for Withdrawal: ?LowMEDICAL DECISION MAKING DiagnosisProblem List  ICD-10-CM   Y IOP/PHP THERAPY PLAN  Opioid use disorder, moderate, in sustained remission, on maintenance therapy, dependence (HC Code) (HC CODE) 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	Number and complexity of problems (Low, moderate, or high): Moderate2.	Amount and Complexity of data reviewed and analyzed (Low, moderate, or high): Moderate3.	Risk of Complications of morbidity and mortality of patient management (Low, moderate, or high): ModerateOverall Risk AssessmentModerate - two or more stable chronic illnesses (includes prescription drug management)IMPRESSION & PLAN Formulation / AssessmentPatient is a 69 y.o. 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 Will need to continue to coordinate with his outpatient medical providers to ensure this increase is only temporary. There is no evidence that, for now, pt is misusing his buprenorphine.?The Self-Report [place] Risk Suicide Severity Rating Scale Since Last Visit was completed by Tyler Khan and reviewed by myself today.  Additionally, risk assessment from Tyler Khan's intake assessment and follow up screenings have been reviewed.  Based on Tyler Khan?s clinical presentation today as well as his self-reported symptom ratings today and over the current course of treatment, I have assessed Tyler Khan to be at unchanged risk of harm to self as compared with prior recent assessments.  Based on this assessment, I have continued current treatment plan including implementation of factors to mitigate risks of harm to self as outlined in the last risk assessmentIntervention(s)Medication ManagementLabs/Test/Consults OrderedToxicologyMedication ChangesNonePlan- Continue buprenorphine-naloxone to 6-1.5 mg a day (4-1mg   And 2-0.5 mg daily) for opioid maintenance with the increased dose due to back pain from spinal fracture- Continue escitalopram 10mg  daily?for depression- Continue bupropion XL 300mg  daily?for depression- Continue [facility]- Return to see this MD in 4 weeksElectronically SignedProvider:  Harlene Ramus, M.D.11/24/20213:30 PM

## 2020-09-14 NOTE — Group Note
Group Session:  Alcohol/Substance Abuse RecoveryTELEHEALTH CLINICIAN/GROUP: This clinician is part of the telehealth program and is conducting this visit in a currently approved location.-  For this visit the clinician and patient were present via:   interactive audio and video telecommunications system that permits real-time communications-  Consent was signed via the Patient Acknowledgement and Financial Authorization Form and the Ambulatory Telehealth Consent Form-  State patient is located in: [place]-  The clinician is appropriately licensed in the above state to provide care for this visit.-  Clinician is physically located on site-  Other individuals present during the telehealth encounter and their role/relation: other group members-  Start time: 0900   Stop time:  1000    Total time spent in medical video consultation: 60 minutes          (Total time spent by the provider on the day of service, which includes time spent on chart review, medical video consultation, education, coordination of care/services and counseling)Facilitators:  Renato Gails, LADCSession Focus Relapse prevention Number of Participants 2 Treatment Modality Group Therapy Interventions/Education Relapse Prevention InterventionsHelped patient draft a relapse prevention planHelped patient identify relapse prevention strategiesIdentified positive outcomes for refraining from substancesSubstance Abuse Recovery InterventionsIdentified connection between feelings, thoughts and behaviorsIdentified positive traits to build self esteem Attendance            AttendedEngagement InterventionsNot applicable - patient attended group without difficulty Degree of Participation Active Behavior Appropriate Speech/Thought Process Focused Affect/Mood Full range Insight Moderate Judgment Good Response to Interventions Attentive Individualization Pt compliant with MAT; sober; looking forward to Thanksgiving with his sister and her family in [place]; states that he is feeling better but still has back pain; states that he is making a conscious choice to attend the family gathering despite not feeling 100% because he likes being with his sister, nieces and nephews; states that he is grateful that they are picking him up and that he has been able to have a life long supportive relationship with [place] - Suicide [place]ity Screen    Most Recent Value Have you wished you were dead or wished you could go to sleep and not wake up? no Have you actually had any thoughts of killing yourself? no Have you ever done anything, started to do anything, or prepared to do anything to end your life? no [place] Suicide Risk Level Low Risk   Plan Continue to assist patient with relapse prevention

## 2020-09-20 DIAGNOSIS — F1121 Opioid dependence, in remission: Secondary | ICD-10-CM

## 2020-09-20 DIAGNOSIS — F3340 Major depressive disorder, recurrent, in remission, unspecified: Secondary | ICD-10-CM

## 2020-09-21 ENCOUNTER — Inpatient Hospital Stay: Admit: 2020-09-21 | Discharge: 2020-09-21 | Payer: MEDICARE | Primary: Internal Medicine

## 2020-09-21 NOTE — Group Note
Group Session:  Alcohol/Substance Abuse RecoveryTELEHEALTH CLINICIAN/GROUP: This clinician is part of the telehealth program and is conducting this visit in a currently approved location.-  For this visit the clinician and patient were present via:   interactive audio and video telecommunications system that permits real-time communications-  Consent was signed via the Patient Acknowledgement and Financial Authorization Form and the Ambulatory Telehealth Consent Form-  State patient is located in: [place]-  The clinician is appropriately licensed in the above state to provide care for this visit.-  Clinician is physically located on site-  Other individuals present during the telehealth encounter and their role/relation: other group members-  Start time: 0900      Stop time:  1030    Total time spent in medical video consultation: 90 minutes          (Total time spent by the provider on the day of service, which includes time spent on chart review, medical video consultation, education, coordination of care/services and counseling)Facilitators:  Renato Gails, LADCSession Focus Relapse preventionGroup discussed challenges of the holiday season and ways to stay sober through the holidaysClinician provided link  and information on [facility]- a group fitness and recovery program for improving the emotional, physical and mental health of anyone struggling with addiction or mental health issues Number of Participants 5 Treatment Modality Group Therapy Interventions/Education Relapse Prevention InterventionsHelped patient become skilled in recognizing/process/coping with thoughts/feelingsHelped patient increase awareness of support groupsIdentified positive outcomes for refraining from substancesIdentified thoughts/feelings that lead to relapseIncreased knowledge of addiction and process of recovery Attendance            AttendedEngagement InterventionsNot applicable - patient attended group without difficulty Degree of Participation Moderate Behavior Appropriate Speech/Thought Process Focused Affect/Mood Full range Insight Moderate Judgment Good Response to Interventions Attentive Individualization Pt compliant with MAT; enjoyed Thanksgiving with his sister and extended family; struggling with a head cold and body ache; discouraged by his health challenges; missed his 2 daughters; supportive of [place] - Suicide Severity Screen    Most Recent Value Have you wished you were dead or wished you could go to sleep and not wake up? no Have you actually had any thoughts of killing yourself? no Have you ever done anything, started to do anything, or prepared to do anything to end your life? no [place] Suicide Risk Level Low Risk   Plan Continue to assist patient with relapse prevention

## 2020-09-28 ENCOUNTER — Inpatient Hospital Stay: Admit: 2020-09-28 | Discharge: 2020-09-28 | Payer: MEDICARE | Primary: Internal Medicine

## 2020-09-28 DIAGNOSIS — F3340 Major depressive disorder, recurrent, in remission, unspecified: Secondary | ICD-10-CM

## 2020-09-28 DIAGNOSIS — F1121 Opioid dependence, in remission: Secondary | ICD-10-CM

## 2020-09-28 NOTE — Group Note
Group Session:  Alcohol/Substance Abuse RecoveryTELEHEALTH CLINICIAN/GROUP: This clinician is part of the telehealth program and is conducting this visit in a currently approved location.-  For this visit the clinician and patient were present via:   interactive audio and video telecommunications system that permits real-time communications-  Consent was signed via the Patient Acknowledgement and Financial Authorization Form and the Ambulatory Telehealth Consent Form-  State patient is located in: [place]-  The clinician is appropriately licensed in the above state to provide care for this visit.-  Clinician is physically located on site-  Other individuals present during the telehealth encounter and their role/relation: none and other group members-  Start time: 0900    Stop time:  1030   Total time spent in medical video consultation: 60 minutes          (Total time spent by the provider on the day of service, which includes time spent on chart review, medical video consultation, education, coordination of care/services and counseling)Facilitators:  Renato Gails, LADCSession Focus Relapse prevention Number of Participants 6 Treatment Modality Group Therapy Interventions/Education Relapse Prevention InterventionsHelped patient become skilled in recognizing/process/coping with thoughts/feelingsIdentified alternative activities for social connectionIdentified alternative activities to structure leisure timeIdentified positive outcomes for refraining from substancesIncreased knowledge of addiction and process of recoveryClient to develop drug free peer group/friendships that support sobriety Attendance            AttendedEngagement InterventionsNot applicable - patient attended group without difficulty Degree of Participation Active Behavior Appropriate Speech/Thought Process Focused Affect/Mood Full range Insight Good Judgment Good Response to Interventions Attentive Individualization [place] - Suicide Severity Screen    Most Recent Value Have you wished you were dead or wished you could go to sleep and not wake up? no Have you actually had any thoughts of killing yourself? no Have you ever done anything, started to do anything, or prepared to do anything to end your life? no [place] Suicide Risk Level Low Risk   Plan Continue to assist patient with relapse prevention

## 2020-10-05 ENCOUNTER — Inpatient Hospital Stay: Admit: 2020-10-05 | Discharge: 2020-10-05 | Payer: MEDICARE | Primary: Internal Medicine

## 2020-10-05 DIAGNOSIS — F3340 Major depressive disorder, recurrent, in remission, unspecified: Secondary | ICD-10-CM

## 2020-10-05 DIAGNOSIS — F1121 Opioid dependence, in remission: Secondary | ICD-10-CM

## 2020-10-09 NOTE — Group Note
Group Session:  Alcohol/Substance Abuse RecoveryTELEHEALTH CLINICIAN/GROUP: This clinician is part of the telehealth program and is conducting this visit in a currently approved location.-  For this visit the clinician and patient were present via:   interactive audio and video telecommunications system that permits real-time communications-  Consent was signed via the Patient Acknowledgement and Financial Authorization Form and the Ambulatory Telehealth Consent Form-  State patient is located in: [place]-  The clinician is appropriately licensed in the above state to provide care for this visit.-  Clinician is physically located on site-  Other individuals present during the telehealth encounter and their role/relation: group peers-  Start time: 9:00am      Stop time:  10:30am   Total time spent in medical video consultation: 90 minutes          (Total time spent by the provider on the day of service, which includes time spent on chart review, medical video consultation, education, coordination of care/services and counseling)Facilitators:  Jeanella Cara, LCSWSession Focus Relapse prevention Number of Participants 6 Treatment Modality Group Therapy Interventions/Education Relapse Prevention InterventionsHelped patient become skilled in recognizing/process/coping with thoughts/feelingsIdentified and deactivated triggers to drug/alcohol useIdentified alternative activities for social connectionIdentified alternative activities to structure leisure timeIdentified positive outcomes for refraining from substancesStages of Change InterventionsFamily Dynamics InterventionsSubstance Abuse Recovery InterventionsEncouraged processing of thoughts/feelings/actions related to substance abuseEncouraged patient to identify adaptive ways of coping with negative feeling statesIdentified connection between feelings, thoughts and behaviors Attendance AttendedEngagement InterventionsNot applicable - patient attended group without difficulty Degree of Participation Active Behavior Alert and Appropriate Speech/Thought Process Coherent and Organized Affect/Mood Full range and Stable Insight Good Judgment Good Response to Interventions Engaged, Interested and Receptive Individualization Group members checked in about status of abstinence/sobriety, any recent stressors or triggers they have experienced - and how they have been coping with these, if applicable. Rosanne Ashing reported continued abstinence.He shared that his compression fracture continues to heal - but that the process is taking longer than he anticipated. Peers supported pt around the challenges of managing chronic pain.Pt noted that he is trying to manage expectations related to holidays. He said that his pastor currently has COVID-19, which makes him wonder whether he needs to get tested. He stated that he needs to send out gifts, but isn?t getting out as much as he would like to due to his injury.Rosanne Ashing said he is trying to keep to a manageable goal of doing one or two things per day and that faith, family and friends help him to cope with stress. He shared that he hopes to go caroling next week with some friends, although he?ll wait to see how he feels.Marland KitchenMarland KitchenG[place] - Suicide Severity Screen    Most Recent Value Have you wished you were dead or wished you could go to sleep and not wake up? no Have you actually had any thoughts of killing yourself? no Have you ever done anything, started to do anything, or prepared to do anything to end your life? no [place] Suicide Risk Level Low Risk   Plan Continue to assist patient with relapse preventionContinue per Plan of Care / Interdisciplinary Plan of Care

## 2020-10-12 ENCOUNTER — Inpatient Hospital Stay: Admit: 2020-10-12 | Discharge: 2020-10-12 | Payer: MEDICARE | Primary: Internal Medicine

## 2020-10-12 DIAGNOSIS — F3340 Major depressive disorder, recurrent, in remission, unspecified: Secondary | ICD-10-CM

## 2020-10-12 MED ORDER — BUPRENORPHINE 4 MG-NALOXONE 1 MG SUBLINGUAL FILM
4-1 mg | ORAL_FILM | Freq: Every day | SUBLINGUAL | 1 refills | Status: AC
Start: 2020-10-12 — End: 2020-11-01

## 2020-10-12 NOTE — Progress Notes
[facility] [address] RoadGreen[address] [place] [address]YQMVH Number: [phone] Medication Management Psychiatry MD/LIP Progress Note12/22/2021TELEHEALTH MD/LIP: This clinician is part of the telehealth program and is conducting this visit in a currently approved location.-  For this visit the clinician and patient were present via:   interactive audio and video telecommunications system that permits real-time communications-  Consent was signed via the Patient Acknowledgement and Financial Authorization Form and the Ambulatory Telehealth Consent Form-  State patient is located in: [place]-  The clinician is appropriately licensed in the above state to provide care for this visit.-  Clinician is physically located at home-  Other individuals present during the telehealth encounter and their role/relation: none-  Start time: 03:00PM      Stop time:  03:25PM     Total time spent in medical video consultation: 35 minutes          (Total time spent by the provider on the day of service, which includes time spent on chart review, medical video consultation, education, coordination of care/services and counseling)-  Because this visit was completed over video, a hands-on physical exam was not performed.   Patient/parent or guardian understands and knows to call back if condition changes.Session Type:  Medication ManagementJames Thurlow Gallaga is a 69 y.o., Married, male.SUBJECTIVE Chief Complaint: Patient is a 69 y.o. 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 Says he does not want to continue the additional dose and wants to resume taking 4-1 mg a day only of Suboxone. He still has pain in his back and joints and some abdomnial pain as well but no new symptoms. Is worried he has COVID but no fevers or loss of smell. Just usual fatigue. Will be getting a COVID test on 10/14/20. Not feeling hopeless or helpless but still a bit depressed. Unsure if seasonal or related to his pain. Does plan to go to a Christmas mass at his church but otherwise will remain at home and avoid social interactions if possible. No other complaints.REVIEW OF ALLERGIES/MEDICATIONS/HISTORY I have reviewed the patient's current medications, allergies and past medical historyI have reviewed with the patient, the risks and benefits of the medications prescribed.OBJECTIVE Review of SystemsReview of Systems Constitutional: Positive for fatigue. Respiratory: Negative.  Cardiovascular: Negative.  Gastrointestinal: Positive for abdominal pain. Musculoskeletal: Positive for arthralgias, back pain and gait problem. Neurological: Positive for weakness. Psychiatric/Behavioral: Positive for dysphoric mood and sleep disturbance. Negative for agitation, confusion, hallucinations and suicidal ideas. The patient is not nervous/anxious.   LabsNo new labsCurrent MedicationsCurrent Outpatient Medications Medication Sig Note ? acetaminophen (TYLENOL) 325 MG tablet Take 650 mg by mouth every 6 (six) hours as needed..  ? buprenorphine-naloxone (SUBOXONE) 4-1 mg per sublingual film Place 1 Film under the tongue daily.  ? buPROPion XL (WELLBUTRIN XL) 300 mg 24 hr tablet Take 1 tablet (300 mg total) by mouth every morning.  ? caffeine 200 mg Tab tablet Take 200 mg by mouth daily.  ? calcium carbonate (TUMS) *200 mg CALCIUM* chewable tablet Take 3 tablets by mouth daily.  ? chlordiazePOXIDE (LIBRIUM) 25 MG capsule Take 25 mg by mouth 2 (two) times daily as needed for Anxiety. (Patient not taking: Reported on 07/06/2020)  ? clobetasol (TEMOVATE) 0.05 %  cream APPLY SPARINGLY TO AFFECTED AREA TWICE A DAY  ? co-enzyme Q-10 30 mg capsule Take 30 mg by mouth daily.  ? escitalopram oxalate (LEXAPRO) 10 mg tablet Take 1 tablet (10 mg total) by mouth every morning.  ? fish oil-omega-3 fatty acids 1,000 mg capsule Take 2 g by mouth daily.  ? furosemide (LASIX) 40 MG tablet TAKE 1 AND 1/2 TABLETS ORALLY DAILY AS DIRECTED 07/23/2016: Received from: External Pharmacy ? glucosamine-chondroitin 500-400 mg tablet Take 2 tablets by mouth daily.  ? MATZIM LA 360 mg LA 24 hr extended release tablet   ? mupirocin (BACTROBAN) 2 % ointment   ? warfarin (COUMADIN) 6 MG tablet TAKE 1 TABLET BY MOUTH EVERY DAY  No current facility-administered medications for this encounter. Mental Status ExamGeneral AppearanceHabitus:  ThinGrooming:  GoodPsychiatricAttitude: Cooperative, pleasant and good eye contactPsychomotor Behavior: No psychomotor activation or retardationSpeech: normal rate, volume and prosodyMood: Normal depressed, not anxiousAffect: Congruent to reported mood and appropriately reactive dysphoric, constrictedThought Process: Coherent, logical, goal directedAssociations: NormalThought Content: NormalSuicidal Ideation: No current suicidal plan, ideation or intentHomicidal Ideation: No current homicidal ideation, plan or intentJudgment:  GoodInsight:  FairCognitive EvaluationOrientation: Oriented to person, oriented to place and oriented to date/time Safety and Risk AssessmentRISK ASSESSMENT with C-SSRS and SAFE-TPSY RISK ASSESSMENT SAFE-T WITH C-SSRS??Reason for Assessment:??Routine Ambulatory Psychiatric AssessmentC-SSRS:?Suicidal Ideation:??Since Last Visit??WISH TO BE DEAD: ?No??CURRENT SUICIDAL THOUGHTS: ?No??Have you ever done anything, started to do anything or prepared to do anything to end your life?: ?NoRisk Assessment:???Access to Lethal Methods: ?NoCurrent and Past Psychiatric Diagnoses: ???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??Psychotic Disorder: ?No??Alcohol/Substance Abuse Disorders: ?Sustained Remission??PTSD: ?No??ADHD: ?Defer for Further Assessment??TBI: ?No??Cluster B Personality Disorders or Traits: ?No??Conduct Problems: ?No??Suicide Attempt: ?No Prior Attempts??Presenting Symptoms:??Anhedonia: ?No??Impulsivity: ?No??Hopelessness or Despair: ?No??Anxiety and/or Panic: ?No??Insomnia: ?No??Command Hallucinations: ?No??Psychosis: ?No??Self-injurious Behavior: ?No??Physical Aggression Toward Others: ?No??Violence, Victimization and/or Perpetration: ?No??Family History:???Suicide: ?No??Suicidal Behavior: ?No??Axis I Psychiatric Diagnosis requiring hospitalization: ?No??Precipitants / Stressors: ??Triggering events leading to humiliation, shame and/or despair: ?No??Chronic physical pain or other acute medical problem: ?Yes??Sexual / Physical Abuse: ?No??Substance Intoxication or Withdrawal: ?No??Pending Incarceration: ?No??Legal Problems: ?No??Inadequate Social Supports: ?No??Social Isolation: ?No??Perceived burden on others: ?No??Bullying / Cyberbullying: ?No??Media portrayal of suicide: ?No??Housing Insecurity: ?No??Financial Insecurity: ?No??Stressful Life Events: ?No??Gender / Sexual Identity: ?No??Homelessness: ?No??Change in Treatment: ???Recent inpatient discharge: ?No??Change in provider or treatment: ?No??Hopeless or dissatisfied with provider or treatment: ?No??Non-compliant: ?No??Not receiving treatment: ?NoCurrent and Past Psychiatric Diagnoses:??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Yes??Beloved pets: ?No??Supportive social network of friends or family: ?Yes??Positive therapeutic relationships / Effective mental healthcare: ?Yes??Step 2 External - Engaged in work/school:?n/a.Risk to Self - Self-Injurious Behavior: ??Current Urges to harm Self: ?No??Recent Self-Injury: ?No??History of Self Injury: ?No??Attitude Regarding Self Injury: ?Ego dystonic??Imminent Risk for Self Injury in Community: ?Low??Imminent Risk for Self Injury in Facility: ?LowRisk to Others: ??Current Agitation: ?No??Homicidal/Aggressive Ideation: ?No??Homicidal/Aggressive Threat/Plan: ?No??Recent Violence/Aggression: ?No??Attitude Regarding Aggression / Violence: ?Ego dystonic??Imminent Risk for Violence in Community: ?Low??Imminent Risk for Violence in Facility: ?LowWithdrawal Risk:???Alcohol / Benzodiazepines / Barbiturates: ?Daily use?(prescribed librium)?????Alcohol/Benzodiazepine/Barbiturate Risk for Withdrawal: ?Low??Opioids: ?Daily use?(prescribed suboxone)?????Opioid Risk for Withdrawal: ?LowMEDICAL DECISION MAKING DiagnosisProblem List         ICD-10-CM   Y IOP/PHP THERAPY PLAN  Opioid  use disorder, moderate, in sustained remission, on maintenance therapy, dependence (HC Code) (HC CODE) 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	Number and complexity of problems (Low, moderate, or high): Moderate2.	Amount and Complexity of data reviewed and analyzed (Low, moderate, or high): Moderate3.	Risk of Complications of morbidity and mortality of patient management (Low, moderate, or high): ModerateOverall Risk AssessmentModerate - two or more stable chronic illnesses (includes prescription drug management)IMPRESSION & PLAN Formulation / AssessmentPatient is a 69 y.o. 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 There is no evidence that pt is misusing his buprenorphine.?The Self-Report [place] Risk Suicide Severity Rating Scale Since Last Visit was completed by Ramon Dredge and reviewed by myself today.  Additionally, risk assessment from Estanislado Pandy Franze's intake assessment and follow up screenings have been reviewed.  Based on Jarmar Rousseau [place]?s clinical presentation today as well as his self-reported symptom ratings today and over the current course of treatment, I have assessed Ramon Dredge to be at unchanged risk of harm to self as compared with prior recent assessments.  Based on this assessment, I have continued current treatment plan including implementation of factors to mitigate risks of harm to self as outlined in the last risk assessmentIntervention(s)Medication ManagementLabs/Test/Consults OrderedToxicologyMedication ChangesDiscontinue additional Suboxone dose (2-0.5 mg) dailyPlan- Lower buprenorphine-naloxone to 4-1mg  daily) for opioid maintenance. Discontinue additional dose as above**CHECKED PMP today**- Continue escitalopram 10mg  daily?for depression- Continue bupropion XL 300mg  daily?for depression- Continue [facility]- Return to see this MD in 4 weeksElectronically SignedProvider:  Harlene Ramus, M.D.12/22/20213:35 PM

## 2020-10-14 ENCOUNTER — Inpatient Hospital Stay: Admit: 2020-10-14 | Discharge: 2020-10-14 | Payer: MEDICARE | Primary: Internal Medicine

## 2020-10-14 DIAGNOSIS — Z20828 Contact with and (suspected) exposure to other viral communicable diseases: Secondary | ICD-10-CM

## 2020-10-14 DIAGNOSIS — Z20822 Contact with and (suspected) exposure to covid-19: Secondary | ICD-10-CM

## 2020-10-16 LAB — COVID-19 CLEARANCE OR FOR PLACEMENT ONLY: BKR SARS-COV-2 RNA (COVID-19) (YH): NOT DETECTED

## 2020-10-19 ENCOUNTER — Inpatient Hospital Stay: Admit: 2020-10-19 | Discharge: 2020-10-19 | Payer: MEDICARE | Primary: Internal Medicine

## 2020-10-19 DIAGNOSIS — F1121 Opioid dependence, in remission: Secondary | ICD-10-CM

## 2020-10-19 NOTE — Group Note
Group Session:  Alcohol/Substance Abuse RecoveryTELEHEALTH CLINICIAN/GROUP: This clinician is part of the telehealth program and is conducting this visit in a currently approved location.-  For this visit the clinician and patient were present via:   interactive audio and video telecommunications system that permits real-time communications-  Consent was signed via the Patient Acknowledgement and Financial Authorization Form and the Ambulatory Telehealth Consent Form-  State patient is located in: [place]-  The clinician is appropriately licensed in the above state to provide care for this visit.-  Clinician is physically located on site-  Other individuals present during the telehealth encounter and their role/relation: none and other group members-  Start time: 0900    Stop time:  1030    Total time spent in medical video consultation: 90 minutes          (Total time spent by the provider on the day of service, which includes time spent on chart review, medical video consultation, education, coordination of care/services and counseling)Facilitators:  Renato Gails, LADCSession Focus Relapse preventionPts shared their successes and stressors over the holidays and processed their emotions around it Number of Participants 5 Treatment Modality Group Therapy Interventions/Education Relapse Prevention InterventionsHelped patient become skilled in recognizing/process/coping with thoughts/feelingsIdentified alternative activities to structure leisure timeIdentified positive outcomes for refraining from substancesVerbalized details of the circumstances that led to the most recent relapse Attendance            AttendedEngagement InterventionsNot applicable - patient attended group without difficulty Degree of Participation Active Behavior Appropriate Speech/Thought Process Focused Affect/Mood Full range Insight Moderate Judgment Moderate Response to Interventions Attentive Individualization Pt compliant with MAT; states he had a good Christmas but is still struggling to get over recent health challenges; enjoyed church; taking appropriate precautions against COVID19; [place] - Suicide Severity Screen    Most Recent Value Have you wished you were dead or wished you could go to sleep and not wake up? no Have you actually had any thoughts of killing yourself? no Have you ever done anything, started to do anything, or prepared to do anything to end your life? no [place] Suicide Risk Level Low Risk   Plan Continue to assist patient with relapse prevention

## 2020-10-21 DIAGNOSIS — F3340 Major depressive disorder, recurrent, in remission, unspecified: Secondary | ICD-10-CM

## 2020-10-21 DIAGNOSIS — F1121 Opioid dependence, in remission: Secondary | ICD-10-CM

## 2020-10-26 ENCOUNTER — Inpatient Hospital Stay: Admit: 2020-10-26 | Discharge: 2020-10-26 | Payer: MEDICARE | Primary: Internal Medicine

## 2020-10-26 DIAGNOSIS — F1121 Opioid dependence, in remission: Secondary | ICD-10-CM

## 2020-10-27 NOTE — Group Note
Group Session:  Alcohol/Substance Abuse RecoveryTELEHEALTH CLINICIAN/GROUP: This clinician is part of the telehealth program and is conducting this visit in a currently approved location.-  For this visit the clinician and patient were present via:   interactive audio and video telecommunications system that permits real-time communications-  Consent was signed via the Patient Acknowledgement and Financial Authorization Form and the Ambulatory Telehealth Consent Form-  State patient is located in: [place]-  The clinician is appropriately licensed in the above state to provide care for this visit.-  Clinician is physically located at home-  Other individuals present during the telehealth encounter and their role/relation: none and other group members-  Start time: 0900   Stop time:  100    Total time spent in medical video consultation: 60 minutes          (Total time spent by the provider on the day of service, which includes time spent on chart review, medical video consultation, education, coordination of care/services and counseling)Facilitators:  Renato Gails, LADCSession Focus Relapse preventionGroup members dicussed their hopes and goals for the New Year Number of Participants 4 Treatment Modality Group Therapy Interventions/Education Relapse Prevention InterventionsHelped patient increase community based supportsIdentified positive outcomes for refraining from substances Attendance            AttendedEngagement InterventionsNot applicable - patient attended group without difficulty Degree of Participation Active Behavior Appropriate Speech/Thought Process Focused Affect/Mood Full range Insight Moderate Judgment Good Response to Interventions Attentive Individualization Pt reports compliance with MAT; states that he and his wife enjoyed the holiday season and that he is ready to have a better year; states that last year was very discouraging in many ways -esp health wise and that he is working on staying hopeful that this year will be better[place] - Suicide Severity Screen    Most Recent Value Have you wished you were dead or wished you could go to sleep and not wake up? no Have you actually had any thoughts of killing yourself? no Have you ever done anything, started to do anything, or prepared to do anything to end your life? no [place] Suicide Risk Level Low Risk   Plan Continue to assist patient with relapse prevention

## 2020-10-29 ENCOUNTER — Encounter: Admit: 2020-10-29 | Payer: PRIVATE HEALTH INSURANCE | Attending: Psychiatry | Primary: Internal Medicine

## 2020-11-01 ENCOUNTER — Encounter: Admit: 2020-11-01 | Payer: PRIVATE HEALTH INSURANCE | Attending: Psychiatry | Primary: Internal Medicine

## 2020-11-01 MED ORDER — ESCITALOPRAM 10 MG TABLET
10 mg | ORAL_TABLET | Freq: Every morning | ORAL | 2 refills | Status: AC
Start: 2020-11-01 — End: 2020-11-30

## 2020-11-01 MED ORDER — BUPRENORPHINE 4 MG-NALOXONE 1 MG SUBLINGUAL FILM
4-1 mg | ORAL_FILM | Freq: Every day | SUBLINGUAL | 1 refills | Status: AC
Start: 2020-11-01 — End: 2020-11-16

## 2020-11-01 NOTE — Telephone Encounter
Pt called and is requesting his meds to be refilled.  [facility] to confirm and [facility] does need them.

## 2020-11-02 ENCOUNTER — Inpatient Hospital Stay: Admit: 2020-11-02 | Discharge: 2020-11-02 | Payer: MEDICARE | Primary: Internal Medicine

## 2020-11-02 ENCOUNTER — Ambulatory Visit: Admit: 2020-11-02 | Payer: PRIVATE HEALTH INSURANCE | Primary: Internal Medicine

## 2020-11-02 MED ORDER — BUPROPION HCL XL 300 MG 24 HR TABLET, EXTENDED RELEASE
300 mg | ORAL_TABLET | 1 refills | Status: AC
Start: 2020-11-02 — End: 2020-11-30

## 2020-11-02 NOTE — Telephone Encounter
Pt called regarding refill of buproprion XL  (Wellbutrin) 300 mg.  [facility] and he does need a refill.  90 day supply.

## 2020-11-02 NOTE — Group Note
Group Session:  Alcohol/Substance Abuse RecoveryTELEHEALTH CLINICIAN/GROUP: This clinician is part of the telehealth program and is conducting this visit in a currently approved location.-  For this visit the clinician and patient were present via:   interactive audio and video telecommunications system that permits real-time communications-  Consent was signed via the Patient Acknowledgement and Financial Authorization Form and the Ambulatory Telehealth Consent Form-  State patient is located in: -  The clinician is appropriately licensed in the above state to provide care for this visit.-  Clinician is physically located on site-  Other individuals present during the telehealth encounter and their role/relation: other group members-  Start time: 0900     Stop time:  1030   Total time spent in medical video consultation: 60 minutes          (Total time spent by the provider on the day of service, which includes time spent on chart review, medical video consultation, education, coordination of care/services and counseling)Facilitators:  Renato Gails, LADCSession Focus Relapse preventionGroup members reviewed successes and challenges in the last week and gave recovery oriented feedback to each otherGroup discussed loneliness and ways to overcome/manage it in recovery  Number of Participants 6 Treatment Modality Group Therapy Interventions/Education Relapse Prevention InterventionsEducated clients of the benefits of joining a support groupHelped patient increase community based supportsHelped patient increase self awareness/cylce of relapseIdentified positive outcomes for refraining from substancesIdentified thoughts/feelings that lead to relapseVerbalized details of the circumstances that led to the most recent relapseIncreased knowledge of addiction and process of recovery Attendance            AttendedEngagement InterventionsNot applicable - patient attended group without difficulty Degree of Participation Active Behavior Appropriate Speech/Thought Process Focused Affect/Mood Full range Insight Good Judgment Good Response to Interventions Attentive Individualization Pt identified with the importance of keeping hope alive to regulate emotions; states that based on feedback from this group he has started a gratitude journal in which he includes memories, life lessons and the impact he has had on tohers as things to be grateful for; related to the importance of staying connected with hs faith to avoid loneliness Plan Continue to assist patient with relapse prevention

## 2020-11-09 ENCOUNTER — Inpatient Hospital Stay: Admit: 2020-11-09 | Discharge: 2020-11-09 | Payer: MEDICARE | Primary: Internal Medicine

## 2020-11-09 DIAGNOSIS — F1121 Opioid dependence, in remission: Secondary | ICD-10-CM

## 2020-11-09 NOTE — Group Note
Group Session:  Alcohol/Substance Abuse RecoveryTELEHEALTH CLINICIAN/GROUP: This clinician is part of the telehealth program and is conducting this visit in a currently approved location.-  For this visit the clinician and patient were present via:   interactive audio and video telecommunications system that permits real-time communications-  Consent was signed via the Patient Acknowledgement and Financial Authorization Form and the Ambulatory Telehealth Consent Form-  State patient is located in: [place]-  The clinician is appropriately licensed in the above state to provide care for this visit.-  Clinician is physically located on site-  Other individuals present during the telehealth encounter and their role/relation: other group members-  Start time: 0900      Stop time:  1030  Total time spent in medical video consultation: 90 minutes          (Total time spent by the provider on the day of service, which includes time spent on chart review, medical video consultation, education, coordination of care/services and counseling)Facilitators:  Renato Gails, LADCSession Focus Relapse preventionGroup members reviewed their week, identified coping skills used and their sober supportsGroup members identified self defeating thoughts they had this week and replaced them with self affirming thoughts Number of Participants 6 Treatment Modality Group Therapy Interventions/Education Relapse Prevention InterventionsHelped patient become skilled in recognizing/process/coping with thoughts/feelingsHelped patient identify relapse prevention strategiesHelped patient develop strategies to successfully resist cravingsHelped patient increase awareness of support groupsClient to develop drug free peer group/friendships that support sobrietyRecalled number of relapses in the past years and focus on patterns Attendance            AttendedEngagement InterventionsNot applicable - patient attended group without difficulty Degree of Participation Active Behavior Appropriate Speech/Thought Process Focused Affect/Mood Full range Insight Moderate Judgment Moderate Response to Interventions Attentive Individualization Pt talked about heightened worry for friends who have been hospitalized with COVID; identified I'lll never have the energy to move to to [place] as a persistent negative thought; recognizes that avoiding using absolutes will help him stay hopeful and more positive'; agreed with peer that taking small steps each day towards his goal would give him more confidence; not excited about his birthday but happy that his sister remembered and sent him a gift  (ear pods)[place] - Suicide Severity Screen    Most Recent Value Have you wished you were dead or wished you could go to sleep and not wake up? no Have you actually had any thoughts of killing yourself? no Have you ever done anything, started to do anything, or prepared to do anything to end your life? no [place] Suicide Risk Level Low Risk   Plan Continue to assist patient with relapse prevention

## 2020-11-11 ENCOUNTER — Inpatient Hospital Stay: Admit: 2020-11-11 | Discharge: 2020-11-11 | Payer: MEDICARE | Primary: Internal Medicine

## 2020-11-12 LAB — URINE DRUG SCREEN, MEDTOX     (GH)
BKR AMPHETAMINE: NEGATIVE
BKR BARBITURATES: NEGATIVE
BKR BENZODIAZEPINES: POSITIVE — AB
BKR BUPRENORPHINE: POSITIVE — AB
BKR COCAINE (BENZOYLECGONINE): NEGATIVE
BKR METHADONE: NEGATIVE
BKR METHAMPHETAMINE: NEGATIVE
BKR OPIATES: NEGATIVE
BKR OXYCODONE: NEGATIVE
BKR PCP, PHENCYCLIDINE: NEGATIVE
BKR PROPOXYPHENE: NEGATIVE
BKR TCA, TRICYCLIC ANTIDEPRESSANTS: NEGATIVE
BKR THC, CANNABINOIDS: NEGATIVE

## 2020-11-12 LAB — ETHANOL, URINE, RANDOM     (BH GH LMW): BKR ETHANOL URINE: NEGATIVE

## 2020-11-15 ENCOUNTER — Encounter: Admit: 2020-11-15 | Payer: PRIVATE HEALTH INSURANCE | Primary: Internal Medicine

## 2020-11-15 NOTE — Other
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   INFUSION TREATMENT     Level of Care/Treatment Track: ARC CCGFrequency: Once a weekEstimated duration/length of stay in the program: 3 monthsTreatment Modalities: Group Psychotherapy;Pharmacological ManagementPatient Strengths: expresses reasons to live/engage in productive activity;connection to faith community;sense of responsibility to family/others;able to define needs;willing to ask for help when symptoms exacerbate;positive therapeutic relationship with outpatient treatersPatient and/or Family Stated Short Term Personal Goal(s): to be happy...per ptPatient and/or Family Stated Long Term Personal Goal(s): to move to [place] and be closer to his daughter and grandson.. per ptGoal of Treatment #1: Relapse prevention and MATGoal of Treatment #2: increased sober supportGoal of Treatment #3: develop healthier coping skillsThe patient did} participate in the development of this Treatment Plan.Active Multidisciplinary Problems: INTERDISCIPLINARY PROBLEM LISTSubstance Use / Abuse:  ActiveDescriptive Behaviors:  Pt has hx of opiate dependence and has been on MAT at [facility] since 2014; Pt rpeorts compliance with MAT and keeps scheduled appts with dr Tyler Khan Pt's UDS +ve for alcohol on 10/29/2018. Pt stated that he had drank alcohol over the holidays out of boredom ad sense of feeling deprived because of several limitations related to Crohn's disease. Pt understands the risks of continued alcohol use including cross addiction; states intent to not drink alcoholPt identifes loneliness and lack of fulfillment in his marriage as stressorsPt does Meals on Wheels, is ery involved with his church and has a supportive relationship with his daughter in [place] who recently has her first child.Pt's wife is going for family reunion [place] pt will visit his daughter during that timePt used to attend [employer]; has not gone recently; pt continues to be encouraged to resume sober support groups5/4/2020Pt reports staying sober, denies cravings or thoughts of drinking alcohol,compliant with MAT, verbalizes frustration over being essentially confined to his house due to COVID 19 public health guidelines; identifies boredom as stressor; stays connected with [employer] and cingregation through [employer]; also facetimes regularly with his daughter in [place]; disappointed tat he had to cancel his trip to visit her and his grandson.Pt coping adaptively with his feelings; pt continues to be encouraged to attend sober support groups online; 7/30/2020Pt reports continued sobriety, compliance with MAT; articulates feelings of boredom and occasional isolation related to the limitations imposed by the pandemic, expresses coping as best as I can; presents as coping adaptively, taking care of tasks that he had been putting off, staying connected with his children and Church; pt continues to be encouraged to resume Haematologist meetings to increase sober support network10/5/2020Pt reports continued sobriety; compliant with MAT;identifies boredom as a trigger; pt has been working through low mood and lethargy; stated he was sick last 2 weeks, COVID test was negative; states he is feeling better, enjoys out doors in person [employer]; identifies it as a main support system; continues to be encouraged to attend sober support groups online or in person12/2020Pt reports continued sobriety, compliant with MAT, reports occasional depressed mood due to limitations/restrictions caused by pandemic;reports feeling more hopeful when he thinks of how close we are t getting a vaccine states that it does not last for more than a couple of days at a time; identifies going to church in person and meeting up with his friends there as uplifting; does virtual bible  study classes every Wednesdays, misses doing meals on wheels; states he stays busy getting things done around the house and feels good when he tackles small tasks like clearing the clutter from his table; enjoys zoom calls with his daughters; pt continues to be encouraged to attend sober support groups online3/2021Pt reports continued sobriety, compliant with MAT, increasing discouraged by limitations due to COVID19- hopeful of getting ting vaccinated soon; working through Therapist, sports; feels supported and well taken care of by PCP; identifies his faith and church activities as his main coping skills; enjoys face time with his daughter and grandson in [place]; pt continues to be reluctant to engage in online/i person sober support groups5/25/2021Pt is sober, reports compliance with MAT; attends [facility] regularly; presents as more tangential, and more difficult to redirect; expresses increased anxiety around difficulty completing tasks; identifies procrastination as a huge challenge; discouraged in his marriage; feels close and supported by his daughter in [place] and his sister in [place]; client has been encouraged to attend sober support groups but it resistant to doing so; identifies Church as his main support system and his faith as his coping skill; 8/9/2021Pt reports continued sobriety, compliance with MAT; pt struggles with loneliness, low mood and energy morre frequently than in the past; makes effort o stay busy and structure his day;identifies face timing with daughter and grandson in [place] as a mood booster; identifies church as giving him sense of meaningfulness and belongingness10/26/2021Pt has been working through low mood and low energy in the last several weeks; he has been mostly confined to the house due to a compression fracture which has been very painful; discouraged occasionally by the multiple health challenges he has had to overcome since childhood; states he increased his suboxone does on the advice of Dr Lenis Noon to manage the pain; ran out of suboxone early and asked Dr Sharyon Cable for an early refill; pt was advised to have Dr Lenis Noon call Dr Sharyon Cable and discuss; pt's appt with Dr Sharyon Cable is being rescheduled for today to discuss treatment plan/cordinate care with Dr Wiliam Ke identifies church activities and his faith as what keeps him hopeful and optimistic;pt has been encouraged to attend virtual sober support groups but has not followed through. Pt states that he it takes a lot of determination for him to get his day to day tasks like grocery shopping done and that he does not have the energy to do more than the absolute minimum; pt identifies C[facility] as his main support system; states that since he is familiar with everyone on the group it is a comfortable and easy space for him to connect with1/25/2022Pt reports compliance with medication; pt missed monthly UDS last month; aware and notes desire to be compliant; mood is better; continues to be discouraged by low level of motivation to initiate tasks; working on accepting limitations that he identifies as age and health related; started writing a gratitude journal to help him stay positive; pt needs to increase sober support; minimally receptive to attending sober support groupsPatient Stated Goal(s):  To stay sober and be happy and have a sense of purpose...per ptLong Term Goal(s):  Medication compliance/adjustments, Prevent recurrence/exacerbation of symptoms and Caregiver engagement in discharge plan/follow-up treatment     Target Date:  1/2022Short Term Goal/Objective:  Patient will report a decrease in substance use as evidenced by ? self report, -ve UDS and breathalyzers Target Date: 01/2021     Status:  Mostly achievedShort Term Goal/Objective:  Patient will identify/practice/demonstrate the use of adaptive coping skills  as evidenced by ? verbalizing and demonstrating emotional regulation and stress management skills     Target Date: 01/2021     Status:  Partially achievedShort Term Goal/Objective:  Patient will increase their sober support network as evidenced by ? attending online sober support groups 1-2 x weekly     Target Date: 01/2021     Status:  Minimal progressIntervention/Frequency:  Encourage identification/practice/mastery of adaptive coping skills weekly (tele health)Intervention/Frequency:  Educate patients on the benefits of joining a support group and utilizing support systems weekly (tele health)Intervention/Frequency:  Encourage patient to Identify connection between feeling, thoughts and behaviors weekly (tele health)Other Assessed Need(s) (Medical/Psychosocial)(List any other assessed needs that were identified in the assessment of the patient that are NOT included in the Interdisciplinary Problem List noted above.  Indicate what the INTERVENTION, REFERRAL or DEFERRAL will be for each of the other Assessed Needs identified (e.g. name of PCP, other medical provider, therapist, community resource, etc.)No other assessed need identifiedMedical / Psychological Assessments: Medications:Assessment of medication side effects and/or adverse medication reactions is ongoing     Current Outpatient Medications Medication Sig Note ? acetaminophen (TYLENOL) 325 MG tablet Take 650 mg by mouth every 6 (six) hours as needed..  ? buprenorphine-naloxone (SUBOXONE) 4-1 mg per sublingual film Place 1 Film under the tongue daily.  ? buPROPion XL (WELLBUTRIN XL) 300 mg 24 hr tablet TAKE 1 TABLET BY MOUTH EVERY DAY IN THE MORNING  ? caffeine 200 mg Tab tablet Take 200 mg by mouth daily.  ? calcium carbonate (TUMS) *200 mg CALCIUM* chewable tablet Take 3 tablets by mouth daily.  ? chlordiazePOXIDE (LIBRIUM) 25 MG capsule Take 25 mg by mouth 2 (two) times daily as needed for Anxiety. (Patient not taking: Reported on 07/06/2020)  ? clobetasol (TEMOVATE) 0.05 % cream APPLY SPARINGLY TO AFFECTED AREA TWICE A DAY  ? co-enzyme Q-10 30 mg capsule Take 30 mg by mouth daily.  ? escitalopram oxalate (LEXAPRO) 10 mg tablet Take 1 tablet (10 mg total) by mouth every morning.  ? fish oil-omega-3 fatty acids 1,000 mg capsule Take 2 g by mouth daily.  ? furosemide (LASIX) 40 MG tablet TAKE 1 AND 1/2 TABLETS ORALLY DAILY AS DIRECTED 07/23/2016: Received from: External Pharmacy ? glucosamine-chondroitin 500-400 mg tablet Take 2 tablets by mouth daily.  ? MATZIM LA 360 mg LA 24 hr extended release tablet   ? mupirocin (BACTROBAN) 2 % ointment   ? warfarin (COUMADIN) 6 MG tablet TAKE 1 TABLET BY MOUTH EVERY DAY  No current facility-administered medications for this encounter. Consult / Referral Made:No Consults or Referrals made at this timeDischarge Planning: Anticipated Discharge Date: 3 monthsAnticipated Discharge Level of Care/ Disposition:Outpatient TherapyPlan: Pt will stay compliant with medications and treatment goals; keep scheduled MAT appts with Dr Sharyon Cable, attend [facility] weekly, be encouraged to attend sober support groups

## 2020-11-16 ENCOUNTER — Inpatient Hospital Stay: Admit: 2020-11-16 | Discharge: 2020-11-16 | Payer: MEDICARE | Primary: Internal Medicine

## 2020-11-16 ENCOUNTER — Encounter: Admit: 2020-11-16 | Payer: PRIVATE HEALTH INSURANCE | Attending: Psychiatry | Primary: Internal Medicine

## 2020-11-16 DIAGNOSIS — F1121 Opioid dependence, in remission: Secondary | ICD-10-CM

## 2020-11-16 LAB — ZZZALCOHOL METABOLITES, QUANT, URINE     (BH GH)
ETHYL GLUCURONIDE: NEGATIVE ng/mL (ref <500)
ETHYL SULFATE: NEGATIVE ng/mL (ref <100)

## 2020-11-16 LAB — NOTES AND COMMENTS

## 2020-11-16 MED ORDER — BUPRENORPHINE 4 MG-NALOXONE 1 MG SUBLINGUAL FILM
4-1 mg | ORAL_FILM | Freq: Every day | SUBLINGUAL | 1 refills | Status: AC
Start: 2020-11-16 — End: 2020-12-15

## 2020-11-16 NOTE — Group Note
Group Session:  Alcohol/Substance Abuse RecoveryGroup Session:  Alcohol/Substance Abuse RecoveryTELEHEALTH CLINICIAN/GROUP: This clinician is part of the telehealth program and is conducting this visit in a currently approved location.-  For this visit the clinician and patient were present via:   interactive audio and video telecommunications system that permits real-time communications-  Consent was signed via the Patient Acknowledgement and Financial Authorization Form and the Ambulatory Telehealth Consent Form-  State patient is located in: [place]-  The clinician is appropriately licensed in the above state to provide care for this visit.-  Clinician is physically located on site-  Other individuals present during the telehealth encounter and their role/relation: other group members-  Start time: 0900      Stop time:  1030    Total time spent in medical video consultation: 90 minutes          (Total time spent by the provider on the day of service, which includes time spent on chart review, medical video consultation, education, coordination of care/services and counseling)Facilitators:  Renato Gails, LADCSession Focus Relapse preventionGroup members gave and received recovery oriented feedback form peersGroup discussed wellness wheel and how to use it to identify which wellness dimensions needs more of their attention right now Number of Participants 6 Treatment Modality Group Therapy Interventions/Education Relapse Prevention InterventionsHelped patient become skilled in recognizing/process/coping with thoughts/feelingsIdentified alternative activities to structure leisure timeIdentified positive outcomes for refraining from substancesIncreased knowledge of addiction and process of recoveryClient to develop drug free peer group/friendships that support sobriety Attendance            AttendedEngagement InterventionsNot applicable - patient attended group without difficulty Degree of Participation Active Behavior Appropriate Speech/Thought Process Focused Affect/Mood Full range Insight Moderate Judgment Moderate Response to Interventions Attentive Individualization [place] - Suicide Severity Screen    Most Recent Value Have you wished you were dead or wished you could go to sleep and not wake up? no Have you actually had any thoughts of killing yourself? no Have you ever done anything, started to do anything, or prepared to do anything to end your life? no [place] Suicide Risk Level Low Risk  pt shared that he had a good birthday and that both his daughters reached out to him in meaningful ways; enjoyed seeing pictures of his daughter with the children of her partner; excited that his daughter in [place] is pregnant for the 2nd time; worried about his friends battling COVID; supportive of peersColumb[place] - Suicide Severity Screen    Most Recent Value Have you wished you were dead or wished you could go to sleep and not wake up? no Have you actually had any thoughts of killing yourself? no Have you ever done anything, started to do anything, or prepared to do anything to end your life? no [place] Suicide Risk Level Low Risk   Plan Continue to assist patient with relapse prevention

## 2020-11-16 NOTE — Telephone Encounter
[facility] and the pharmacist told me his last refill was 10/13/2020.  He does need a refill.

## 2020-11-21 DIAGNOSIS — F1121 Opioid dependence, in remission: Secondary | ICD-10-CM

## 2020-11-23 ENCOUNTER — Inpatient Hospital Stay: Admit: 2020-11-23 | Discharge: 2020-11-23 | Payer: MEDICARE | Primary: Internal Medicine

## 2020-11-24 NOTE — Group Note
Group Session:  Alcohol/Substance Abuse RecoveryTELEHEALTH CLINICIAN/GROUP: This clinician is part of the telehealth program and is conducting this visit in a currently approved location.-  For this visit the clinician and patient were present via:   interactive audio and video telecommunications system that permits real-time communications-  Consent was signed via the Patient Acknowledgement and Financial Authorization Form and the Ambulatory Telehealth Consent Form-  State patient is located in: [place]-  The clinician is appropriately licensed in the above state to provide care for this visit.-  Clinician is physically located on site-  Other individuals present during the telehealth encounter and their role/relation:other group members-  Start time: 0900     Stop time:  1030   Total time spent in medical video consultation: 90 minutes          (Total time spent by the provider on the day of service, which includes time spent on chart review, medical video consultation, education, coordination of care/services and counseling)Facilitators:  Renato Gails, LADCSession Focus Relapse preventionSubstance abuse educationGroup welcomed new member who introduced himself to the group and identified his goalsClinician reviewed and facilitated group discussion on stages of relapse; group members gave examples of emotional and mental relapses/relapse justifications Number of Participants 6 Treatment Modality Group Therapy Interventions/Education Relapse Prevention InterventionsEducated clients of the benefits of joining a support groupIdentified alternative activities to structure leisure timeIdentified positive outcomes for refraining from substancesIdentified thoughts/feelings that lead to relapseIncreased knowledge of addiction and process of recoveryStages of Change InterventionsIdentified and described the clients Stage of ChangeIdentified current life stages of changeUnderstand the stages of change Attendance            AttendedEngagement InterventionsNot applicable - patient attended group without difficulty Degree of Participation Active Behavior Appropriate Speech/Thought Process Focused Affect/Mood Full range Insight Good Judgment Good Response to Interventions Attentive Individualization [place] - Suicide Severity Screen    Most Recent Value Have you wished you were dead or wished you could go to sleep and not wake up? no Have you actually had any thoughts of killing yourself? no Have you ever done anything, started to do anything, or prepared to do anything to end your life? 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

## 2020-11-30 ENCOUNTER — Inpatient Hospital Stay: Admit: 2020-11-30 | Discharge: 2020-11-30 | Payer: MEDICARE | Primary: Internal Medicine

## 2020-11-30 DIAGNOSIS — F3340 Major depressive disorder, recurrent, in remission, unspecified: Secondary | ICD-10-CM

## 2020-11-30 DIAGNOSIS — F1121 Opioid dependence, in remission: Secondary | ICD-10-CM

## 2020-11-30 MED ORDER — BUPROPION HCL XL 300 MG 24 HR TABLET, EXTENDED RELEASE
300 mg | ORAL_TABLET | Freq: Every morning | ORAL | 2 refills | Status: AC
Start: 2020-11-30 — End: ?

## 2020-11-30 MED ORDER — ESCITALOPRAM 10 MG TABLET
10 mg | ORAL_TABLET | Freq: Every morning | ORAL | 2 refills | Status: AC
Start: 2020-11-30 — End: ?

## 2020-11-30 NOTE — Group Note
Group Session:  Alcohol/Substance Abuse RecoveryTELEHEALTH CLINICIAN/GROUP: This clinician is part of the telehealth program and is conducting this visit in a currently approved location.-  For this visit the clinician and patient were present via:   interactive audio and video telecommunications system that permits real-time communications-  Consent was signed via the Patient Acknowledgement and Financial Authorization Form and the Ambulatory Telehealth Consent Form-  State patient is located in: [place]-  The clinician is appropriately licensed in the above state to provide care for this visit.-  Clinician is physically located on site-  Other individuals present during the telehealth encounter and their role/relation: other group members-  Start time: 0900      Stop time:  1030    Total time spent in medical video consultation: 90 minutes          (Total time spent by the provider on the day of service, which includes time spent on chart review, medical video consultation, education, coordination of care/services and counseling)Facilitators:  Renato Gails, LADCSession Focus Relapse prevention Number of Participants 7 Treatment Modality Group Therapy Interventions/Education Relapse Prevention InterventionsEducated clients of the benefits of joining a support groupHelped patient become skilled in recognizing/process/coping with thoughts/feelingsIdentified alternative activities for social connectionIdentified positive outcomes for refraining from substancesIncreased knowledge of addiction and process of recovery Attendance            AttendedEngagement InterventionsNot applicable - patient attended group without difficulty Degree of Participation Active Behavior Appropriate Speech/Thought Process Focused Affect/Mood Full range Insight Moderate Judgment Good Response to Interventions Attentive Individualization [place] - Suicide Severity Screen    Most Recent Value Have you wished you were dead or wished you could go to sleep and not wake up? no Have you actually had any thoughts of killing yourself? no Have you ever done anything, started to do anything, or prepared to do anything to end your life? no [place] Suicide Risk Level Low Risk   Plan Continue to assist patient with relapse prevention

## 2020-11-30 NOTE — Progress Notes
[facility]    Outpatient Medication Management Psychiatry MD/LIP Progress Note  11/30/2020  Session Type:  Medication Management    IN PERSON: This clinician is conducting this visit in-person at a site within our enrolled clinical locations.   -  Start time: 01:00PM      Stop time:  01:25PM     Total time spent: 35 minutes            (Total time spent by the provider on the day of service, which includes time spent on chart review, medical video consultation, education, coordination of care/services and counseling)    SUBJECTIVE     Chief Complaint: Patient is a 70 y.o. man with opioid use disorder, on buprenorphine maintenance, major depressive disorder, PMH of Afib, Crohn's Disease, ileostomy, and anemia, who presents for medication management appt.     Interim History:  The pt says he is still having nagging back, neck and shoulder pain. Reports that he was advised to take just Tylenol but will speak with his doctor about non-medication approaches like massage, PT and acupuncture. Says he is fine staying on the current dose of buprenorphine. Has overall been in good spirits. Sleep is fine. Has lost some weight and is a bit concerned but says his appetite is OK. Not feeling hopeless or sad. No other psych or physical complaints.      OBJECTIVE        Review of Systems  Review of Systems   Constitutional: Positive for appetite change and unexpected weight change.   Respiratory: Negative.    Cardiovascular: Negative.    Musculoskeletal: Positive for arthralgias, back pain, myalgias and neck pain.   Neurological: Positive for weakness.   Psychiatric/Behavioral: Negative for confusion, decreased concentration, dysphoric mood, hallucinations, self-injury, sleep disturbance and suicidal ideas. The patient is not nervous/anxious and is not hyperactive.        Vitals  Vital Signs - Last 3 Values (Cross Encounter)      Office Visit from 07/25/2017 in [facility] Office Visit from 06/11/2017 in [facility] Infusion from 03/04/2017 in [facility]   Temp 98.9 ?F (37.2 ?C) 97.9 ?F (36.6 ?C) 98.4 ?F (36.9 ?C)   Pulse 52 62 73   BP 125/69 143/81 121/71   Patient Position ? ? Sitting          Labs (2 weeks)  No results found for this or any previous visit (from the past 336 hour(s)).    Current Medications  Current Outpatient Medications   Medication Sig Dispense Refill   ? acetaminophen (TYLENOL) 325 MG tablet Take 650 mg by mouth every 6 (six) hours as needed..     ? buprenorphine-naloxone (SUBOXONE) 4-1 mg per sublingual film Place 1 Film under the tongue daily. 30 Film 0   ? buPROPion XL (WELLBUTRIN XL) 300 mg 24 hr tablet Take 1 tablet (300 mg total) by mouth every morning. 30 tablet 1   ? caffeine 200 mg Tab tablet Take 200 mg by mouth daily.     ? calcium carbonate (TUMS) *200 mg CALCIUM* chewable tablet Take 3 tablets by mouth daily.     ? co-enzyme Q-10 30 mg capsule Take 30 mg by mouth daily.     ? escitalopram oxalate (LEXAPRO) 10 mg tablet Take 1 tablet (10 mg total) by mouth every morning. 30 tablet 1   ? fish oil-omega-3 fatty acids 1,000 mg capsule Take 2 g by mouth daily.     ?  furosemide (LASIX) 40 MG tablet TAKE 1 AND 1/2 TABLETS ORALLY DAILY AS DIRECTED  5   ? glucosamine-chondroitin 500-400 mg tablet Take 2 tablets by mouth daily.     ? MATZIM LA 360 mg LA 24 hr extended release tablet      ? warfarin (COUMADIN) 6 MG tablet TAKE 1 TABLET BY MOUTH EVERY DAY  5     No current facility-administered medications for this encounter.       Mental Status Exam  General Appearance  Habitus was thin. Grooming was fair.    Musculoskeletal  Posture was impaired.   slouched  Psychiatric Examination  Attitude: pleasant, cooperative, good eye contact   Psychomotor Behavior: normal   Speech:   Rate: was hyperverbal.  Volume: normal   [place]ody: normal   Mood: fine   Affect:  Euthymic  Affective Range: constricted   Affective Intensity: normal   Thought Process:  Linear Associations: normal   Thought Content: no delusions or hallucinations   Suicidal Ideation: no current suicidal ideation, plan or intent   Violent Ideation: no current violent/homicidal ideation, intent or plan   Insight: Good   Judgment: Good   Cognitive Evaluation  Sensorium: alert   Orientation: oriented to person, place and date         SAFETY AND RISK ASSESSMENT     RISK ASSESSMENT with C-SSRS and SAFE-T     PSY RISK ASSESSMENT SAFE-T WITH C-SSRS?  ?Reason for Assessment:??Routine Ambulatory Psychiatric Assessment  C-SSRS:?  Suicidal Ideation:??Since Last Visit  ??WISH TO BE DEAD: ?No  ??CURRENT SUICIDAL THOUGHTS: ?No  ??Have you ever done anything, started to do anything or prepared to do anything to end your life?: ?No  Risk Assessment:?  ??Access to Lethal Methods: ?No  Current and Past Psychiatric Diagnoses: ?  ??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  ??Psychotic Disorder: ?No  ??Alcohol/Substance Abuse Disorders: ?Sustained Remission  ??PTSD: ?No  ??ADHD: ?Defer for Further Assessment  ??TBI: ?No  ??Cluster B Personality Disorders or Traits: ?No  ??Conduct Problems: ?No  ??Suicide Attempt: ?No Prior Attempts  ??Presenting Symptoms:  ??Anhedonia: ?No  ??Impulsivity: ?No  ??Hopelessness or Despair: ?No  ??Anxiety and/or Panic: ?No  ??Insomnia: ?No  ??Command Hallucinations: ?No  ??Psychosis: ?No  ??Self-injurious Behavior: ?No  ??Physical Aggression Toward Others: ?No  ??Violence, Victimization and/or Perpetration: ?No  ??Family History:?  ??Suicide: ?No  ??Suicidal Behavior: ?No  ??Axis I Psychiatric Diagnosis requiring hospitalization: ?No  ??Precipitants / Stressors:   ??Triggering events leading to humiliation, shame and/or despair: ?No  ??Chronic physical pain or other acute medical problem: ?Yes  ??Sexual / Physical Abuse: ?No  ??Substance Intoxication or Withdrawal: ?No  ??Pending Incarceration: ?No  ??Legal Problems: ?No  ??Inadequate Social Supports: ?No  ??Social Isolation: ?No  ??Perceived burden on others: ?No  ??Bullying / Cyberbullying: ?No  ??Media portrayal of suicide: ?No  ??Housing Insecurity: ?No  ??Financial Insecurity: ?No  ??Stressful Life Events: ?No  ??Gender / Sexual Identity: ?No  ??Homelessness: ?No  ??Change in Treatment: ?  ??Recent inpatient discharge: ?No  ??Change in provider or treatment: ?No  ??Hopeless or dissatisfied with provider or treatment: ?No  ??Non-compliant: ?No  ??Not receiving treatment: ?No  Current and Past Psychiatric Diagnoses:  ??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  ??Psychotic Disorder: ?No  ??Alcohol/Substance Abuse Disorders: ?Sustained Remission  ??PTSD: ?No  ??ADHD: ?Defer for Further Assessment  ??TBI: ?No  ??Cluster B Personality Disorders or Traits: ?No  ??Conduct Problems: ?No  ??Suicide Attempt: ?No Prior Attempts  Protective Factors:   ??Internal:?  ??Ability  to cope with stress: ?Yes  ??Frustration tolerance: ?Yes  ??Religious beliefs: ?Yes  ??Fear of death or the actual act of killing self: ?Yes  ??Identifies reasons for living: ?Yes  ??Problem solving skills: ?Yes  ??External:   ??Cultural, spiritual and/or moral attitudes against suicide: ?Yes  ??Responsibility to children: ?Yes  ??Beloved pets: ?No  ??Supportive social network of friends or family: ?Yes  ??Positive therapeutic relationships / Effective mental healthcare: ?Yes  ??Step 2 External - Engaged in work/school:?n/a.  Risk to Self - Self-Injurious Behavior:   ??Current Urges to harm Self: ?No  ??Recent Self-Injury: ?No  ??History of Self Injury: ?No  ??Attitude Regarding Self Injury: ?Ego dystonic  ??Imminent Risk for Self Injury in Community: ?Low  ??Imminent Risk for Self Injury in Facility: ?Low  Risk to Others:   ??Current Agitation: ?No  ??Homicidal/Aggressive Ideation: ?No  ??Homicidal/Aggressive Threat/Plan: ?No  ??Recent Violence/Aggression: ?No  ??Attitude Regarding Aggression / Violence: ?Ego dystonic  ??Imminent Risk for Violence in Community: ?Low  ??Imminent Risk for Violence in Facility: ?Low  Withdrawal Risk:?  ??Alcohol / Benzodiazepines / Barbiturates: ?Daily use?(prescribed librium)  ?????Alcohol/Benzodiazepine/Barbiturate Risk for Withdrawal: ?Low  ??Opioids: ?Daily use?(prescribed suboxone)  ?????Opioid Risk for Withdrawal: ?Low    MEDICAL DECISION MAKING   Diagnosis  Problem List           ICD-10-CM       Y IOP/PHP THERAPY PLAN    Opioid use disorder, moderate, in sustained remission, on maintenance   therapy, dependence (HC Code) (HC CODE) F11.21       INFUSION TREATMENT            Patient Active Problem List   Diagnosis SNOMED [place](R)   ? Ileostomy in place [facility] Code) (HC CODE) ILEOSTOMY PRESENT   ? Opioid use disorder, moderate, in sustained remission, on maintenance therapy, dependence (HC Code) (HC CODE) OPIOID DEPENDENCE IN REMISSION   ? Atrial fibrillation (HC Code) (HC CODE) ATRIAL FIBRILLATION   ? Vascular catheter infection, sequela INFECTION OF VASCULAR CATHETER   ? Anxiety ANXIETY   ? Crohn's disease in remission (HC Code) (HC CODE) CROHN'S DISEASE IN REMISSION   ? Acute blood loss anemia ACUTE POSTHEMORRHAGIC ANEMIA   ? 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 Recurrent major depressive disorder, in remission (HC CODE) RECURRENT MAJOR DEPRESSION IN REMISSION       Principal diagnosis is unchanged from initial intake assessment: yes    Medical Decision-Making:  1. Number and complexity of problems (Minimal, low, moderate or high): Moderate  2. Amount and complexity of data reviewed and analyzed (Minimal, limited, moderate or extensive): Moderate  3. Risk of complications of morbidity and/or mortality of patient management (Minimal, low, moderate or high): Moderate    ASSESSMENT & PLAN   Assessment  Patient is a 70 y.o. man with opioid use disorder, on buprenorphine maintenance, major depressive disorder, PMH of Afib, Crohn's Disease, ileostomy, and anemia, who presents for medication management appt.     The Self-Report [place] Risk Suicide Severity Rating Scale Since Last Visit was completed by Shauna Hugh and reviewed by myself today.  Additionally, risk assessment from Marry Guan Sann's intake assessment and follow up screenings have been reviewed.  Based on Keng Jewel Leisinger?s clinical presentation today as well as their self-reported symptom ratings today and over the current course of treatment, I have assessed Shauna Hugh to be at unchanged risk of harm to self as compared with prior recent assessments.  Based on this  assessment, I have continued current treatment plan including implementation of factors to mitigate risks of harm to self as outlined in the last risk assessment    Plan  - Continue buprenorphine-naloxone?to 4-1mg ?daily?for opioid maintenance. Will be due for refill in 2 weeks  - Continue escitalopram 10mg  daily?for depression  - Continue bupropion XL 300mg  daily?for depression  - Continue [facility] CCG  - Return to see this MD in?6?weeks    I have reviewed with the patient the risks, benefits and alternatives of the medications prescribed.    Electronically Signed  Physician/Provider:  Harlene Ramus, M.D.  11/30/2020   1:23 PM    [facility]  [address]  [place] [address]  Phone Number: [phone]

## 2020-12-01 LAB — URINE DRUG SCREEN, MEDTOX     (GH)
BKR AMPHETAMINE: NEGATIVE
BKR BARBITURATES: NEGATIVE
BKR BENZODIAZEPINES: POSITIVE — AB
BKR BUPRENORPHINE: POSITIVE — AB
BKR COCAINE (BENZOYLECGONINE): NEGATIVE
BKR METHADONE: NEGATIVE
BKR METHAMPHETAMINE: NEGATIVE
BKR OPIATES: NEGATIVE
BKR OXYCODONE: NEGATIVE
BKR PCP, PHENCYCLIDINE: NEGATIVE
BKR PROPOXYPHENE: NEGATIVE
BKR TCA, TRICYCLIC ANTIDEPRESSANTS: NEGATIVE
BKR THC, CANNABINOIDS: NEGATIVE

## 2020-12-07 ENCOUNTER — Inpatient Hospital Stay: Admit: 2020-12-07 | Discharge: 2020-12-07 | Payer: MEDICARE | Primary: Internal Medicine

## 2020-12-07 DIAGNOSIS — F1121 Opioid dependence, in remission: Secondary | ICD-10-CM

## 2020-12-08 NOTE — Group Note
Group Session:  Alcohol/Substance Abuse RecoveryTELEHEALTH CLINICIAN/GROUP: This clinician is part of the telehealth program and is conducting this visit in a currently approved location.-  For this visit the clinician and patient were present via:   interactive audio and video telecommunications system that permits real-time communications-  Consent was signed via the Patient Acknowledgement and Financial Authorization Form and the Ambulatory Telehealth Consent Form-  State patient is located in: [place]-  The clinician is appropriately licensed in the above state to provide care for this visit.-  Clinician is physically located on site-  Other individuals present during the telehealth encounter and their role/relation: other group members-  Start time: 0900      Stop time:  1030     Total time spent in medical video consultation: 90 minutes          (Total time spent by the provider on the day of service, which includes time spent on chart review, medical video consultation, education, coordination of care/services and counseling)Facilitators:  Renato Gails, LADCSession Focus Relapse preventionPts shared successes and stressors from their week and got feebdcak form peersGroup discussion on emotional sobriety; pt's identified and processed an emotion that they were not allowed/encouraged to feel while growing up Number of Participants 7 Treatment Modality Group Therapy Interventions/Education Relapse Prevention InterventionsHelped patient become skilled in recognizing/process/coping with thoughts/feelingsIdentified alternative activities to structure leisure timeIdentified positive outcomes for refraining from substancesIncreased knowledge of addiction and process of recovery Attendance            AttendedEngagement InterventionsNot applicable - patient attended group without difficulty Degree of Participation Active Behavior Appropriate Speech/Thought Process Focused Affect/Mood Full range Insight Moderate Judgment Good Response to Interventions Attentive Individualization Pt reports stable mood, compliant with MAT; no cravings or thoughts of drinking alcohol; mourning loss of friend to COVID: worried about his wife's sister in [place] who is sick; states that when his wife goes to visit her in the next week or two he plans to go visit his daughter in [place] because she is pregnant with her 2nd child and  needs support and help; pt talked about his father  Insisting on him doing chores and staying active despite his real health challenge of Crohns at age 70; states that that discipline has helped him a lot as an adult because he 'does not give in easily[place] - Suicide Severity Screen    Most Recent Value Have you wished you were dead or wished you could go to sleep and not wake up? no Have you actually had any thoughts of killing yourself? no Have you ever done anything, started to do anything, or prepared to do anything to end your life? no [place] Suicide Risk Level Low Risk   Plan Continue to assist patient with relapse prevention

## 2020-12-14 ENCOUNTER — Inpatient Hospital Stay: Admit: 2020-12-14 | Discharge: 2020-12-14 | Payer: MEDICARE | Primary: Internal Medicine

## 2020-12-14 ENCOUNTER — Encounter: Admit: 2020-12-14 | Payer: PRIVATE HEALTH INSURANCE | Attending: Psychiatry | Primary: Internal Medicine

## 2020-12-14 DIAGNOSIS — F1121 Opioid dependence, in remission: Secondary | ICD-10-CM

## 2020-12-14 NOTE — Group Note
Group Session:  Alcohol/Substance Abuse RecoveryTELEHEALTH CLINICIAN/GROUP: This clinician is part of the telehealth program and is conducting this visit in a currently approved location.-  For this visit the clinician and patient were present via:   interactive audio and video telecommunications system that permits real-time communications-  Consent was signed via the Patient Acknowledgement and Financial Authorization Form and the Ambulatory Telehealth Consent Form-  State patient is located in: [place]-  The clinician is appropriately licensed in the above state to provide care for this visit.-  Clinician is physically located on site-  Other individuals present during the telehealth encounter and their role/relation: other group members-  Start time: 0900      Stop time:  1030   Total time spent in medical video consultation: 90 minutes          (Total time spent by the provider on the day of service, which includes time spent on chart review, medical video consultation, education, coordination of care/services and counseling)Facilitators:  Renato Gails, LADCSession Focus Relapse preventionGroup focus was on emotional triggers and emotional sobriety; group members identified situations from the past week where they noticed that they were emotionally sober  Number of Participants 6 Treatment Modality Group Therapy Interventions/Education Relapse Prevention InterventionsEducated clients of the benefits of joining a support groupHelped patient become skilled in recognizing/process/coping with thoughts/feelingsIdentified positive outcomes for refraining from substancesIdentified thoughts/feelings that lead to relapseIncreased knowledge of addiction and process of recovery Attendance            AttendedEngagement InterventionsNot applicable - patient attended group without difficulty Degree of Participation Active Behavior Appropriate Speech/Thought Process Focused Affect/Mood Full range Insight Good Judgment Good Response to Interventions Attentive Individualization [place] - Suicide Severity Screen    Most Recent Value Have you wished you were dead or wished you could go to sleep and not wake up? no Have you actually had any thoughts of killing yourself? no Have you ever done anything, started to do anything, or prepared to do anything to end your life? no [place] Suicide Risk Level Low Risk   Plan Continue to assist patient with relapse prevention

## 2020-12-15 MED ORDER — BUPRENORPHINE 4 MG-NALOXONE 1 MG SUBLINGUAL FILM
4-1 mg | ORAL_FILM | Freq: Every day | SUBLINGUAL | 1 refills | Status: AC
Start: 2020-12-15 — End: 2021-01-04

## 2020-12-19 DIAGNOSIS — F3340 Major depressive disorder, recurrent, in remission, unspecified: Secondary | ICD-10-CM

## 2020-12-19 DIAGNOSIS — F1121 Opioid dependence, in remission: Secondary | ICD-10-CM

## 2020-12-21 ENCOUNTER — Inpatient Hospital Stay: Admit: 2020-12-21 | Discharge: 2020-12-21 | Payer: MEDICARE | Primary: Internal Medicine

## 2020-12-21 DIAGNOSIS — F419 Anxiety disorder, unspecified: Secondary | ICD-10-CM

## 2020-12-21 NOTE — Group Note
Group Session:  Alcohol/Substance Abuse RecoveryTELEHEALTH CLINICIAN/GROUP: This clinician is part of the telehealth program and is conducting this visit in a currently approved location.-  For this visit the clinician and patient were present via:   interactive audio and video telecommunications system that permits real-time communications-  Consent was signed via the Patient Acknowledgement and Financial Authorization Form and the Ambulatory Telehealth Consent Form-  State patient is located in: [place]-  The clinician is appropriately licensed in the above state to provide care for this visit.-  Clinician is physically located on site-  Other individuals present during the telehealth encounter and their role/relation: other group members-  Start time: 0900      Stop time:  1030     Total time spent in medical video consultation: 90 minutes          (Total time spent by the provider on the day of service, which includes time spent on chart review, medical video consultation, education, coordination of care/services and counseling)Facilitators:  Renato Gails, LADCSession Focus Relapse preventionStages of changePts discussed their week and gave recovery oriented feedback using I statements; Pts reflected on what they/the rest of the group would have learnt abut them if they read their journal from 10 years ago Number of Participants 5 Treatment Modality Group Therapy Interventions/Education Relapse Prevention InterventionsEducated clients of the benefits of joining a support groupHelped patient become skilled in recognizing/process/coping with thoughts/feelingsHelped patient increase community based supportsIncreased knowledge of addiction and process of recovery Attendance            AttendedEngagement InterventionsNot applicable - patient attended group without difficulty Degree of Participation Moderate Behavior Appropriate Speech/Thought Process Focused Affect/Mood Full range Insight Moderate Judgment Good Response to Interventions Attentive Individualization [place] - Suicide Severity Screen    Most Recent Value Have you wished you were dead or wished you could go to sleep and not wake up? no Have you actually had any thoughts of killing yourself? no Have you ever done anything, started to do anything, or prepared to do anything to end your life? no [place] Suicide Risk Level Low Risk   Plan Continue to assist patient with relapse prevention

## 2020-12-26 ENCOUNTER — Encounter: Admit: 2020-12-26 | Payer: PRIVATE HEALTH INSURANCE | Attending: Internal Medicine | Primary: Internal Medicine

## 2020-12-26 ENCOUNTER — Inpatient Hospital Stay: Admit: 2020-12-26 | Discharge: 2020-12-26 | Payer: MEDICARE | Primary: Internal Medicine

## 2020-12-26 DIAGNOSIS — C419 Malignant neoplasm of bone and articular cartilage, unspecified: Secondary | ICD-10-CM

## 2020-12-26 DIAGNOSIS — R053 Chronic cough: Secondary | ICD-10-CM

## 2020-12-26 DIAGNOSIS — Z7901 Long term (current) use of anticoagulants: Secondary | ICD-10-CM

## 2020-12-27 ENCOUNTER — Other Ambulatory Visit: Admit: 2020-12-27 | Payer: PRIVATE HEALTH INSURANCE | Attending: Internal Medicine | Primary: Internal Medicine

## 2020-12-27 ENCOUNTER — Inpatient Hospital Stay: Admit: 2020-12-27 | Discharge: 2020-12-27 | Payer: MEDICARE | Primary: Internal Medicine

## 2020-12-27 DIAGNOSIS — R634 Abnormal weight loss: Secondary | ICD-10-CM

## 2020-12-27 DIAGNOSIS — N189 Chronic kidney disease, unspecified: Secondary | ICD-10-CM

## 2020-12-27 DIAGNOSIS — D649 Anemia, unspecified: Secondary | ICD-10-CM

## 2020-12-27 DIAGNOSIS — M2550 Pain in unspecified joint: Secondary | ICD-10-CM

## 2020-12-27 DIAGNOSIS — Z5181 Encounter for therapeutic drug level monitoring: Secondary | ICD-10-CM

## 2020-12-27 LAB — COMPREHENSIVE METABOLIC PANEL
BKR A/G RATIO: 1 — ABNORMAL HIGH (ref <=0.90)
BKR ALANINE AMINOTRANSFERASE (ALT): 27 U/L (ref 12–78)
BKR ALBUMIN: 3.5 g/dL (ref 3.4–5.0)
BKR ALKALINE PHOSPHATASE: 137 U/L — ABNORMAL HIGH (ref 20–135)
BKR ANION GAP: 8 (ref 5–18)
BKR ASPARTATE AMINOTRANSFERASE (AST): 25 U/L (ref 5–37)
BKR AST/ALT RATIO: 0.9
BKR BILIRUBIN TOTAL: 0.4 mg/dL (ref 0.0–1.0)
BKR BLOOD UREA NITROGEN: 32 mg/dL — ABNORMAL HIGH (ref 8–25)
BKR BUN / CREAT RATIO: 17.9 (ref 8.0–25.0)
BKR CALCIUM: 8.6 mg/dL (ref 8.4–10.3)
BKR CHLORIDE: 105 mmol/L (ref 95–115)
BKR CO2: 28 mmol/L (ref 21–32)
BKR CREATININE: 1.79 mg/dL — ABNORMAL HIGH (ref 0.50–1.30)
BKR EGFR (AFR AMER): 45 mL/min/{1.73_m2} (ref >60)
BKR EGFR (NON AFRICAN AMERICAN): 38 mL/min/{1.73_m2} (ref >60)
BKR GLOBULIN: 3.6 g/dL
BKR GLUCOSE: 105 mg/dL — ABNORMAL HIGH (ref 70–100)
BKR OSMOLALITY CALCULATION: 289 mOsm/kg (ref 275–295)
BKR POTASSIUM: 3.4 mmol/L — ABNORMAL LOW (ref 3.5–5.1)
BKR PROTEIN TOTAL: 7.1 g/dL (ref 6.4–8.2)
BKR SODIUM: 141 mmol/L — ABNORMAL HIGH (ref 136–145)

## 2020-12-27 LAB — URINE DRUG SCREEN, MEDTOX     (GH)
BKR AMPHETAMINE: NEGATIVE
BKR BARBITURATES: NEGATIVE
BKR BENZODIAZEPINES: POSITIVE — AB
BKR BUPRENORPHINE: POSITIVE — AB
BKR COCAINE (BENZOYLECGONINE): NEGATIVE
BKR METHADONE: NEGATIVE
BKR METHAMPHETAMINE: NEGATIVE
BKR OPIATES: NEGATIVE
BKR OXYCODONE: NEGATIVE
BKR PCP, PHENCYCLIDINE: NEGATIVE
BKR PROPOXYPHENE: NEGATIVE
BKR TCA, TRICYCLIC ANTIDEPRESSANTS: NEGATIVE
BKR THC, CANNABINOIDS: NEGATIVE

## 2020-12-27 LAB — CBC WITHOUT DIFFERENTIAL
BKR WAM ANALYZER ANC: 2.98 x 1000/ÂµL (ref 2.00–7.60)
BKR WAM HEMATOCRIT (2 DEC): 31.1 % — ABNORMAL LOW (ref 38.50–50.00)
BKR WAM HEMOGLOBIN: 9.5 g/dL — ABNORMAL LOW (ref 13.2–17.1)
BKR WAM MCH (PG): 26.9 pg — ABNORMAL LOW (ref 27.0–33.0)
BKR WAM MCHC: 30.5 g/dL — ABNORMAL LOW (ref 31.0–36.0)
BKR WAM MCV: 88.1 fL (ref 80.0–100.0)
BKR WAM PLATELETS: 104 x1000/ÂµL — ABNORMAL LOW (ref 150–420)
BKR WAM RDW-CV: 16.1 % — ABNORMAL HIGH (ref 11.0–15.0)
BKR WAM RED BLOOD CELL COUNT.: 3.53 M/ÂµL — ABNORMAL LOW (ref 4.00–6.00)
BKR WAM WHITE BLOOD CELL COUNT: 5 x1000/ÂµL (ref 4.0–11.0)

## 2020-12-27 LAB — TSH: BKR THYROID STIMULATING HORMONE: 3.9 u[IU]/mL — ABNORMAL HIGH (ref 0.358–3.740)

## 2020-12-27 LAB — ETHANOL, URINE, RANDOM     (BH GH LMW): BKR ETHANOL URINE: NEGATIVE

## 2020-12-27 LAB — SEDIMENTATION RATE (ESR): BKR SEDIMENTATION RATE, ERYTHROCYTE: 89 mm/h — ABNORMAL HIGH (ref 0–20)

## 2020-12-27 LAB — C-REACTIVE PROTEIN     (CRP): BKR C-REACTIVE PROTEIN: 0.4 mg/dL (ref 0.0–1.0)

## 2020-12-27 LAB — PHOSPHORUS     (BH GH L LMW YH): BKR PHOSPHORUS: 3.7 mg/dL (ref 2.5–4.9)

## 2020-12-27 LAB — MAGNESIUM: BKR MAGNESIUM: 2.4 mg/dL — ABNORMAL HIGH (ref 1.4–2.2)

## 2020-12-28 ENCOUNTER — Inpatient Hospital Stay: Admit: 2020-12-28 | Discharge: 2020-12-28 | Payer: MEDICARE | Primary: Internal Medicine

## 2020-12-28 DIAGNOSIS — F1121 Opioid dependence, in remission: Secondary | ICD-10-CM

## 2020-12-28 DIAGNOSIS — F3340 Major depressive disorder, recurrent, in remission, unspecified: Secondary | ICD-10-CM

## 2020-12-28 DIAGNOSIS — F419 Anxiety disorder, unspecified: Secondary | ICD-10-CM

## 2020-12-28 LAB — TESTOSTERONE, TOTAL     (BH GH L LMW YH): BKR TESTOSTERONE TOTAL: 363 ng/dL (ref 87–780)

## 2020-12-28 NOTE — Group Note
Group Session:  Alcohol/Substance Abuse RecoveryTELEHEALTH CLINICIAN/GROUP: This clinician is part of the telehealth program and is conducting this visit in a currently approved location.-  For this visit the clinician and patient were present via:   interactive audio and video telecommunications system that permits real-time communications-  Consent was signed via the Patient Acknowledgement and Financial Authorization Form and the Ambulatory Telehealth Consent Form-  State patient is located in: [place]-  The clinician is appropriately licensed in the above state to provide care for this visit.-  Clinician is physically located on site-  Other individuals present during the telehealth encounter and their role/relation: other group members-  Start time: 0900      Stop time:  1030    Total time spent in medical video consultation: 90 minutes          (Total time spent by the provider on the day of service, which includes time spent on chart review, medical video consultation, education, coordination of care/services and counseling)Facilitators:  Renato Gails, LADCSession Focus Stages of change Number of Participants 5 Treatment Modality Group Therapy Interventions/Education Stages of Change InterventionsIdentified and described the clients Stage of ChangeIdentified current life stages of change Attendance            AttendedEngagement InterventionsNot applicable - patient attended group without difficulty Degree of Participation Moderate Behavior Appropriate Speech/Thought Process Focused Affect/Mood Full range Insight Good Judgment Moderate Response to Interventions Attentive Individualization [place] - Suicide Severity Screen    Most Recent Value Have you wished you were dead or wished you could go to sleep and not wake up? no Have you actually had any thoughts of killing yourself? no Have you ever done anything, started to do anything, or prepared to do anything to end your life? no [place] Suicide Risk Level Low Risk   Plan Continue to assist patient with relapse prevention

## 2020-12-30 LAB — SELENIUM SERUM/PLASMA: SELENIUM: 135 mcg/L (ref 63–160)

## 2020-12-30 LAB — ZINC: ZINC: 156 ug/dL — ABNORMAL HIGH (ref 60–130)

## 2020-12-31 LAB — ZZZALCOHOL METABOLITES, QUANT, URINE     (BH GH)
ETHYL GLUCURONIDE: NEGATIVE ng/mL (ref <500)
ETHYL SULFATE: NEGATIVE ng/mL (ref <100)

## 2020-12-31 LAB — NOTES AND COMMENTS

## 2021-01-04 ENCOUNTER — Inpatient Hospital Stay: Admit: 2021-01-04 | Discharge: 2021-01-04 | Payer: MEDICARE | Primary: Internal Medicine

## 2021-01-04 DIAGNOSIS — F419 Anxiety disorder, unspecified: Secondary | ICD-10-CM

## 2021-01-04 DIAGNOSIS — F3340 Major depressive disorder, recurrent, in remission, unspecified: Secondary | ICD-10-CM

## 2021-01-04 DIAGNOSIS — F1121 Opioid dependence, in remission: Secondary | ICD-10-CM

## 2021-01-04 MED ORDER — BUPRENORPHINE 4 MG-NALOXONE 1 MG SUBLINGUAL FILM
4-1 mg | ORAL_FILM | Freq: Every day | SUBLINGUAL | 1 refills | Status: AC
Start: 2021-01-04 — End: ?

## 2021-01-04 NOTE — Progress Notes
[facility]    Outpatient Medication Management Psychiatry MD/LIP Progress Note  01/04/2021  Session Type:  Medication Management    IN PERSON: This clinician is conducting this visit in-person at a site within our enrolled clinical locations.   -  Start time: 01:00PM      Stop time:  01:35PM     Total time spent: 45 minutes            (Total time spent by the provider on the day of service, which includes time spent on chart review, medical video consultation, education, coordination of care/services and counseling)    SUBJECTIVE     Chief Complaint: Patient is a 70 y.o. man with opioid use disorder, on buprenorphine maintenance, major depressive disorder, PMH of Afib, Crohn's Disease, ileostomy, and anemia, who presents for medication management appt.     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 Says that the pain is frustrating in that OTC and prescribed non-opioid anaglesics have had limited effect and he does not want to take more Suboxone, noting he just prefers the 4-1 mg a day. Says his MD put him on prednisone 20 mg/day for one month to see if it helps. Says he has noticed a mild improvement so far. Pt says he is going to [place] for 2 weeks and that his wife is going to [place] 4 [place]. Says he wants to ensure he gets his Suboxone filled before he leaves on 01/10/21. Pt says he is not feeling sad, hopeless, helpless or suicidal. No other complaints.      OBJECTIVE        Review of Systems  Review of Systems   Constitutional: Positive for fatigue.   Respiratory: Negative.    Cardiovascular: Negative.    Musculoskeletal: Positive for arthralgias, back pain, myalgias and neck pain.   Neurological: Positive for weakness.   Psychiatric/Behavioral: Negative for confusion, decreased concentration, dysphoric mood, hallucinations, self-injury, sleep disturbance and suicidal ideas. The patient is not nervous/anxious and is not hyperactive.        Vitals  Vital Signs - Last 3 Values (Cross Encounter)      Office Visit from 07/25/2017 in [facility] Office Visit from 06/11/2017 in [facility] Infusion Therapy Infusion from 03/04/2017 in [facility]   Temp 98.9 ?F (37.2 ?C) 97.9 ?F (36.6 ?C) 98.4 ?F (36.9 ?C)   Pulse 52 62 73   BP 125/69 143/81 121/71   Patient Position ? ? Sitting          Labs (2 weeks)  Recent Results (from the past 336 hour(s))   C-reactive protein (CRP)    Collection Time: 12/26/20  2:00 PM   Result Value Ref Range    C-Reactive Protein 0.4 0.0 - 1.0 mg/dL   Zinc    Collection Time: 12/26/20  2:00 PM   Result Value Ref Range    Zinc 156 (H) 60 - 130 mcg/dL   Selenium serum/plasma    Collection Time: 12/26/20  2:00 PM   Result Value Ref Range    Selenium 135 63 - 160 mcg/L   CBC without differential    Collection Time: 12/26/20  2:00 PM   Result Value Ref Range    WBC 5.0 4.0 - 11.0 x1000/?L    RBC 3.53 (L) 4.00 - 6.00 M/?L    Hemoglobin 9.5 (L) 13.2 - 17.1 g/dL    Hematocrit 11.91 (L) 38.50 -  50.00 %    MCV 88.1 80.0 - 100.0 fL    MCH 26.9 (L) 27.0 - 33.0 pg    MCHC 30.5 (L) 31.0 - 36.0 g/dL    RDW-CV 16.1 (H) 09.6 - 15.0 %    Platelets 104 (L) 150 - 420 x1000/?L    MPV      ANC(Abs Neutrophil Count) 2.98 2.00 - 7.60 x 1000/?L   Sedimentation rate (ESR)    Collection Time: 12/26/20  2:00 PM   Result Value Ref Range    Sedimentation Rate (ESR) 89 (H) 0 - 20 mm/hr   Magnesium    Collection Time: 12/26/20  2:00 PM   Result Value Ref Range    Magnesium 2.4 (H) 1.4 - 2.2 mg/dL   Phosphorus     ([facility])    Collection Time: 12/26/20  2:00 PM   Result Value Ref Range    Phosphorus 3.7 2.5 - 4.9 mg/dL   Testosterone, total     ([facility])    Collection Time: 12/26/20  2:00 PM   Result Value Ref Range    Testosterone, Total 363 87 - 780 ng/dL   TSH    Collection Time: 12/26/20  2:00 PM   Result Value Ref Range    Thyroid Stimulating Hormone 3.900 (H) 0.358 - 3.740 ?IU/mL   Comprehensive metabolic panel Collection Time: 12/26/20  2:00 PM   Result Value Ref Range    Sodium 141 136 - 145 mmol/L    Potassium 3.4 (L) 3.5 - 5.1 mmol/L    Chloride 105 95 - 115 mmol/L    CO2 28 21 - 32 mmol/L    Anion Gap 8 5 - 18    Glucose 105 (H) 70 - 100 mg/dL    BUN 32 (H) 8 - 25 mg/dL    Creatinine 0.45 (H) 0.50 - 1.30 mg/dL    Calcium 8.6 8.4 - 40.9 mg/dL    BUN/Creatinine Ratio 17.9 8.0 - 25.0    Total Protein 7.1 6.4 - 8.2 g/dL    Albumin 3.5 3.4 - 5.0 g/dL    Total Bilirubin 0.4 0.0 - 1.0 mg/dL    Alkaline Phosphatase 137 (H) 20 - 135 U/L    Alanine Aminotransferase (ALT) 27 12 - 78 U/L    Aspartate Aminotransferase (AST) 25 5 - 37 U/L    Globulin 3.6 g/dL    A/G Ratio 1.0     AST/ALT Ratio 0.9 See Comment    eGFR (Afr Amer) 45 >60 mL/min/1.31m2    eGFR (NON African-American) 38 >60 mL/min/1.39m2    Osmolality Calculation 289 275 - 295 mOsm/kg   Urine drug screen, Medtox     [facility])    Collection Time: 12/27/20  4:22 PM   Result Value Ref Range    THC, Cannabinoids Negative Negative    PCP, Phencyclidine Negative Negative    Cocaine (Benzyolecgonine) Negative Negative    Methamphetamine Negative Negative    Opiates Negative Negative    Amphetamine Negative Negative    Benzodiazepines Positive (A) Negative    TCA, Tricyclic Antidepressants Negative Negative    Oxycodone Negative Negative    Propoxyphene Negative Negative    Buprenorphine Positive (A) Negative    Methadone Negative Negative    Barbiturates Negative Negative   Alcohol, urine, random     ([facility])    Collection Time: 12/27/20  4:22 PM   Result Value Ref Range    Alcohol Urine Negative Negative   Alcohol  metabolites, quant, urine     [facility])    Collection Time: 12/27/20  4:22 PM   Result Value Ref Range    Ethyl Glucuronide NEGATIVE <500 ng/mL    Ethyl Sulfate NEGATIVE <100 ng/mL    Alcohol Metab Comments SEE NOTE    Notes and Comments    Collection Time: 12/27/20  4:22 PM   Result Value Ref Range    Notes and Comments SEE NOTE        Current Medications  Current Outpatient Medications   Medication Sig Dispense Refill   ? acetaminophen (TYLENOL) 325 MG tablet Take 650 mg by mouth every 6 (six) hours as needed..     ? buprenorphine-naloxone (SUBOXONE) 4-1 mg per sublingual film Place 1 Film under the tongue daily. 30 Film 0   ? buPROPion XL (WELLBUTRIN XL) 300 mg 24 hr tablet Take 1 tablet (300 mg total) by mouth every morning. 30 tablet 1   ? caffeine 200 mg Tab tablet Take 200 mg by mouth daily.     ? calcium carbonate (TUMS) *200 mg CALCIUM* chewable tablet Take 3 tablets by mouth daily.     ? co-enzyme Q-10 30 mg capsule Take 30 mg by mouth daily.     ? escitalopram oxalate (LEXAPRO) 10 mg tablet Take 1 tablet (10 mg total) by mouth every morning. 30 tablet 1   ? fish oil-omega-3 fatty acids 1,000 mg capsule Take 2 g by mouth daily.     ? furosemide (LASIX) 40 MG tablet TAKE 1 AND 1/2 TABLETS ORALLY DAILY AS DIRECTED  5   ? glucosamine-chondroitin 500-400 mg tablet Take 2 tablets by mouth daily.     ? MATZIM LA 360 mg LA 24 hr extended release tablet      ? warfarin (COUMADIN) 6 MG tablet TAKE 1 TABLET BY MOUTH EVERY DAY  5     No current facility-administered medications for this encounter.       Mental Status Exam  General Appearance  Habitus was thin. Grooming was fair.    Musculoskeletal  Posture was impaired.   slouched  Psychiatric Examination  Attitude: open, cooperative, good eye contact   Psychomotor Behavior: normal   Speech:   Rate: was hyperverbal.  Volume: normal   [place]ody: normal   Mood: fine   Affect:  Euthymic  Affective Range: constricted   Affective Intensity: normal   Thought Process: was circumstantial.  Associations: normal   Thought Content: no delusions or hallucinations   Suicidal Ideation: no current suicidal ideation, plan or intent   Violent Ideation: no current violent/homicidal ideation, intent or plan   Insight: Good   Judgment: Good   Cognitive Evaluation  Sensorium: alert   Orientation: oriented to person, place and date         SAFETY AND RISK ASSESSMENT     RISK ASSESSMENT with C-SSRS and SAFE-T     PSY RISK ASSESSMENT SAFE-T WITH C-SSRS?  ?Reason for Assessment:??Routine Ambulatory Psychiatric Assessment  C-SSRS:?  Suicidal Ideation:??Since Last Visit  ??WISH TO BE DEAD: ?No  ??CURRENT SUICIDAL THOUGHTS: ?No  ??Have you ever done anything, started to do anything or prepared to do anything to end your life?: ?No  Risk Assessment:?  ??Access to Lethal Methods: ?No  Current and Past Psychiatric Diagnoses: ?  ??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  ??Psychotic Disorder: ?No  ??Alcohol/Substance Abuse Disorders: ?Sustained Remission  ??PTSD: ?No  ??ADHD: ?Defer for Further Assessment  ??TBI: ?No  ??Cluster B Personality Disorders or Traits: ?No  ??  Conduct Problems: ?No  ??Suicide Attempt: ?No Prior Attempts  ??Presenting Symptoms:  ??Anhedonia: ?No  ??Impulsivity: ?No  ??Hopelessness or Despair: ?No  ??Anxiety and/or Panic: ?No  ??Insomnia: ?No  ??Command Hallucinations: ?No  ??Psychosis: ?No  ??Self-injurious Behavior: ?No  ??Physical Aggression Toward Others: ?No  ??Violence, Victimization and/or Perpetration: ?No  ??Family History:?  ??Suicide: ?No  ??Suicidal Behavior: ?No  ??Axis I Psychiatric Diagnosis requiring hospitalization: ?No  ??Precipitants / Stressors:   ??Triggering events leading to humiliation, shame and/or despair: ?No  ??Chronic physical pain or other acute medical problem: ?Yes  ??Sexual / Physical Abuse: ?No  ??Substance Intoxication or Withdrawal: ?No  ??Pending Incarceration: ?No  ??Legal Problems: ?No  ??Inadequate Social Supports: ?No  ??Social Isolation: ?No  ??Perceived burden on others: ?No  ??Bullying / Cyberbullying: ?No  ??Media portrayal of suicide: ?No  ??Housing Insecurity: ?No  ??Financial Insecurity: ?No  ??Stressful Life Events: ?No  ??Gender / Sexual Identity: ?No  ??Homelessness: ?No  ??Change in Treatment: ?  ??Recent inpatient discharge: ?No  ??Change in provider or treatment: ?No  ??Hopeless or dissatisfied with provider or treatment: ?No  ??Non-compliant: ?No  ??Not receiving treatment: ?No  Current and Past Psychiatric Diagnoses:  ??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  ??Psychotic Disorder: ?No  ??Alcohol/Substance Abuse Disorders: ?Sustained Remission  ??PTSD: ?No  ??ADHD: ?Defer for Further Assessment  ??TBI: ?No  ??Cluster B Personality Disorders or Traits: ?No  ??Conduct Problems: ?No  ??Suicide Attempt: ?No Prior Attempts  Protective Factors:   ??Internal:?  ??Ability to cope with stress: ?Yes  ??Frustration tolerance: ?Yes  ??Religious beliefs: ?Yes  ??Fear of death or the actual act of killing self: ?Yes  ??Identifies reasons for living: ?Yes  ??Problem solving skills: ?Yes  ??External:   ??Cultural, spiritual and/or moral attitudes against suicide: ?Yes  ??Responsibility to children: ?Yes  ??Beloved pets: ?No  ??Supportive social network of friends or family: ?Yes  ??Positive therapeutic relationships / Effective mental healthcare: ?Yes  ??Step 2 External - Engaged in work/school:?n/a.  Risk to Self - Self-Injurious Behavior:   ??Current Urges to harm Self: ?No  ??Recent Self-Injury: ?No  ??History of Self Injury: ?No  ??Attitude Regarding Self Injury: ?Ego dystonic  ??Imminent Risk for Self Injury in Community: ?Low  ??Imminent Risk for Self Injury in Facility: ?Low  Risk to Others:   ??Current Agitation: ?No  ??Homicidal/Aggressive Ideation: ?No  ??Homicidal/Aggressive Threat/Plan: ?No  ??Recent Violence/Aggression: ?No  ??Attitude Regarding Aggression / Violence: ?Ego dystonic  ??Imminent Risk for Violence in Community: ?Low  ??Imminent Risk for Violence in Facility: ?Low  Withdrawal Risk:?  ??Alcohol / Benzodiazepines / Barbiturates: ?Daily use?(prescribed librium)  ?????Alcohol/Benzodiazepine/Barbiturate Risk for Withdrawal: ?Low  ??Opioids: ?Daily use?(prescribed suboxone)  ?????Opioid Risk for Withdrawal: ?Low    MEDICAL DECISION MAKING   Diagnosis  Problem List           ICD-10-CM       Y IOP/PHP THERAPY PLAN    Opioid use disorder, moderate, in sustained remission, on maintenance   therapy, dependence (HC Code) (HC CODE) F11.21       INFUSION TREATMENT            Patient Active Problem List   Diagnosis SNOMED [place](R)   ? Ileostomy in place [facility] Code) (HC CODE) ILEOSTOMY PRESENT   ? Opioid use disorder, moderate, in sustained remission, on maintenance therapy, dependence (HC Code) (HC CODE) OPIOID DEPENDENCE IN REMISSION   ? Atrial fibrillation (HC Code) (HC CODE) ATRIAL FIBRILLATION   ? Vascular catheter infection, sequela INFECTION OF  VASCULAR CATHETER   ? Anxiety ANXIETY   ? Crohn's disease in remission (HC Code) (HC CODE) CROHN'S DISEASE IN REMISSION   ? Acute blood loss anemia ACUTE POSTHEMORRHAGIC ANEMIA   ? 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 Recurrent major depressive disorder, in remission (HC CODE) RECURRENT MAJOR DEPRESSION IN REMISSION       Principal diagnosis is unchanged from initial intake assessment: yes    Medical Decision-Making:  1. Number and complexity of problems (Minimal, low, moderate or high): Moderate  2. Amount and complexity of data reviewed and analyzed (Minimal, limited, moderate or extensive): Moderate  3. Risk of complications of morbidity and/or mortality of patient management (Minimal, low, moderate or high): Moderate    ASSESSMENT & PLAN   Assessment  Patient is a 70 y.o. man with opioid use disorder, on buprenorphine maintenance, major depressive disorder, PMH of Afib, Crohn's Disease, ileostomy, and anemia, who presents for medication management appt.     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 Some concern about his long-standing use of temazepam (prescribed by his primary MD) but no evidence of misuse of benzodiazepines.     The Self-Report [place] Risk Suicide Severity Rating Scale Since Last Visit was completed by Tyler Khan and reviewed by myself today.  Additionally, risk assessment from Marry Guan Strough's intake assessment and follow up screenings have been reviewed.  Based on Tyler Khan?s clinical presentation today as well as their self-reported symptom ratings today and over the current course of treatment, I have assessed Tyler Khan to be at unchanged risk of harm to self as compared with prior recent assessments.  Based on this assessment, I have continued current treatment plan including implementation of factors to mitigate risks of harm to self as outlined in the last risk assessment    Plan  - Continue buprenorphine-naloxone?to 4-1mg ?daily?for opioid maintenance. Will be due for refill in 2 weeks  - Continue escitalopram 10mg  daily?for depression  - Continue bupropion XL 300mg  daily?for depression  - Continue temazepam from his primary care provider  - Continue rest of med regimen from his PMD  - Continue [facility] CCG  - Return to see this MD in?4?weeks    I have reviewed with the patient the risks, benefits and alternatives of the medications prescribed.    Electronically Signed  Physician/Provider:  Harlene Ramus, M.D.  01/04/2021   1:45 PM    [facility]  [address]  [place] [address]  Phone Number: [phone]
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
## 2021-01-07 ENCOUNTER — Inpatient Hospital Stay: Admit: 2021-01-07 | Discharge: 2021-01-07 | Payer: MEDICARE | Primary: Internal Medicine

## 2021-01-07 DIAGNOSIS — Z20828 Contact with and (suspected) exposure to other viral communicable diseases: Secondary | ICD-10-CM

## 2021-01-07 DIAGNOSIS — Z20822 Contact with and (suspected) exposure to covid-19: Secondary | ICD-10-CM

## 2021-01-07 LAB — COVID-19 CLEARANCE OR FOR PLACEMENT ONLY: BKR SARS-COV-2 RNA (COVID-19) (YH): NEGATIVE

## 2021-01-09 ENCOUNTER — Encounter: Admit: 2021-01-09 | Payer: PRIVATE HEALTH INSURANCE | Primary: Internal Medicine

## 2021-01-10 ENCOUNTER — Encounter: Admit: 2021-01-10 | Payer: PRIVATE HEALTH INSURANCE | Primary: Internal Medicine

## 2021-01-11 ENCOUNTER — Inpatient Hospital Stay: Admit: 2021-01-11 | Discharge: 2021-01-11 | Payer: MEDICARE | Primary: Internal Medicine

## 2021-01-11 DIAGNOSIS — F419 Anxiety disorder, unspecified: Secondary | ICD-10-CM

## 2021-01-11 DIAGNOSIS — F3340 Major depressive disorder, recurrent, in remission, unspecified: Secondary | ICD-10-CM

## 2021-01-11 DIAGNOSIS — F1121 Opioid dependence, in remission: Secondary | ICD-10-CM

## 2021-01-18 ENCOUNTER — Inpatient Hospital Stay: Admit: 2021-01-18 | Discharge: 2021-01-18 | Payer: MEDICARE | Primary: Internal Medicine

## 2021-01-18 DIAGNOSIS — F419 Anxiety disorder, unspecified: Secondary | ICD-10-CM

## 2021-01-18 DIAGNOSIS — F1121 Opioid dependence, in remission: Secondary | ICD-10-CM

## 2021-01-19 DIAGNOSIS — F1121 Opioid dependence, in remission: Secondary | ICD-10-CM

## 2021-01-19 DIAGNOSIS — F3340 Major depressive disorder, recurrent, in remission, unspecified: Secondary | ICD-10-CM

## 2021-01-25 ENCOUNTER — Inpatient Hospital Stay: Admit: 2021-01-25 | Discharge: 2021-01-25 | Payer: MEDICARE | Primary: Internal Medicine

## 2021-01-25 DIAGNOSIS — F3340 Major depressive disorder, recurrent, in remission, unspecified: Secondary | ICD-10-CM

## 2021-01-25 DIAGNOSIS — F1121 Opioid dependence, in remission: Secondary | ICD-10-CM

## 2021-01-25 DIAGNOSIS — F419 Anxiety disorder, unspecified: Secondary | ICD-10-CM

## 2021-01-25 NOTE — Telephone Encounter
Pt's daughter Lanora Manis [phone] left v/m that pt is in the ICU in a hospital in [facility]

## 2021-01-30 NOTE — Telephone Encounter
Voice mails left for pt and daughter requesting c/b about pt's health status

## 2021-02-01 ENCOUNTER — Inpatient Hospital Stay: Admit: 2021-02-01 | Discharge: 2021-02-01 | Payer: MEDICARE | Primary: Internal Medicine

## 2021-02-01 DIAGNOSIS — F1121 Opioid dependence, in remission: Secondary | ICD-10-CM

## 2021-02-01 DIAGNOSIS — F3340 Major depressive disorder, recurrent, in remission, unspecified: Secondary | ICD-10-CM

## 2021-02-01 DIAGNOSIS — F419 Anxiety disorder, unspecified: Secondary | ICD-10-CM

## 2021-02-08 ENCOUNTER — Encounter: Admit: 2021-02-08 | Payer: PRIVATE HEALTH INSURANCE | Primary: Internal Medicine

## 2021-02-08 ENCOUNTER — Ambulatory Visit: Admit: 2021-02-08 | Payer: PRIVATE HEALTH INSURANCE | Attending: Psychiatry | Primary: Internal Medicine

## 2021-02-08 DIAGNOSIS — F419 Anxiety disorder, unspecified: Secondary | ICD-10-CM

## 2021-02-08 DIAGNOSIS — F3340 Major depressive disorder, recurrent, in remission, unspecified: Secondary | ICD-10-CM

## 2021-02-08 DIAGNOSIS — F1121 Opioid dependence, in remission: Secondary | ICD-10-CM

## 2021-02-08 NOTE — Other
Outpatient Interdisciplinary Treatment [facility] Discharge NoteJames Richard [id number]/20/2022Overview: Current Diagnosis:Problem List         ICD-10-CM   Y IOP/PHP THERAPY PLAN  Opioid use disorder, moderate, in sustained remission, on maintenance therapy, dependence (HC Code) (HC CODE) F11.21   INFUSION TREATMENT     Frequency: dischargedEstimated duration/length of stay in the program: DischargedTreatment Track    MyChart Group Video Visit from 01/04/2021 in [facility] Delivery Network [place] Level of Care OP Program [facility] Track [facility]--Discharged Group 9:00A Program [facility] Treatment Track [facility]-Discharged Group   Treatment Modalities: Group Psychotherapy;Pharmacological ManagementPatient Strengths: expresses reasons to live/engage in productive activity;connection to faith community;sense of responsibility to family/others;able to define needs;willing to ask for help when symptoms exacerbate;positive therapeutic relationship with outpatient treatersPatient and/or Family Stated Short Term Personal Goal(s): to be happy...per ptPatient and/or Family Stated Long Term Personal Goal(s): to move to [place] and be closer to his daughter and grandson.. per ptGoal of Treatment #1: Relapse prevention and MATGoal of Treatment #2: increased sober supportGoal of Treatment #3: develop healthier coping skillsThe patient did participate in the development of this Treatment Plan.Active Multidisciplinary Problems: INTERDISCIPLINARY PROBLEM LISTSubstance Use / Abuse:  ResolvedDescriptive Behaviors:  Pt has hx of opiate dependence and has been on MAT at [facility] since 2014; Pt rpeorts compliance with MAT and keeps scheduled appts with dr Helmut Muster Pt's UDS +ve for alcohol on 10/29/2018. Pt stated that he had drank alcohol over the holidays out of boredom ad sense of feeling deprived because of several limitations related to Crohn's disease. Pt understands the risks of continued alcohol use including cross addiction; states intent to not drink alcoholPt identifes loneliness and lack of fulfillment in his marriage as stressorsPt does Meals on Wheels, is ery involved with his church and has a supportive relationship with his daughter in [place] who recently has her first child.Pt's wife is going for family reunion in the [place] pt will visit his daughter during that timePt used to attend [employer]; has not gone recently; pt continues to be encouraged to resume sober support groups5/4/2020Pt reports staying sober, denies cravings or thoughts of drinking alcohol,compliant with MAT, verbalizes frustration over being essentially confined to his house due to COVID 19 public health guidelines; identifies boredom as stressor; stays connected with [employer] and cingregation through [employer]; also facetimes regularly with his daughter in [place]; disappointed tat he had to cancel his trip to visit her and his grandson.Pt coping adaptively with his feelings; pt continues to be encouraged to attend sober support groups online; 7/30/2020Pt reports continued sobriety, compliance with MAT; articulates feelings of boredom and occasional isolation related to the limitations imposed by the pandemic, expresses coping as best as I can; presents as coping adaptively, taking care of tasks that he had been putting off, staying connected with his children and Church; pt continues to be encouraged to resume Haematologist meetings to increase sober support network10/5/2020Pt reports continued sobriety; compliant with MAT;identifies boredom as a trigger; pt has been working through low mood and lethargy; stated he was sick last 2 weeks, COVID test was negative; states he is feeling better, enjoys out doors in person [employer]; identifies it as a main support system; continues to be encouraged to attend sober support groups online or in person12/2020Pt reports continued sobriety, compliant with MAT, reports occasional depressed mood due to limitations/restrictions caused by pandemic;reports feeling more hopeful when he thinks  of how close we are t getting a vaccine states that it does not last for more than a couple of days at a time; identifies going to church in person and meeting up with his friends there as uplifting; does virtual bible study classes every Wednesdays, misses doing meals on wheels; states he stays busy getting things done around the house and feels good when he tackles small tasks like clearing the clutter from his table; enjoys zoom calls with his daughters; pt continues to be encouraged to attend sober support groups online3/2021Pt reports continued sobriety, compliant with MAT, increasing discouraged by limitations due to COVID19- hopeful of getting ting vaccinated soon; working through Therapist, sports; feels supported and well taken care of by PCP; identifies his faith and church activities as his main coping skills; enjoys face time with his daughter and grandson in [place]; pt continues to be reluctant to engage in online/i person sober support groups5/25/2021Pt is sober, reports compliance with MAT; attends [facility] regularly; presents as more tangential, and more difficult to redirect; expresses increased anxiety around difficulty completing tasks; identifies procrastination as a huge challenge; discouraged in his marriage; feels close and supported by his daughter in [place] and his sister in [place]; client has been encouraged to attend sober support groups but it resistant to doing so; identifies Church as his main support system and his faith as his coping skill; 8/9/2021Pt reports continued sobriety, compliance with MAT; pt struggles with loneliness, low mood and energy morre frequently than in the past; makes effort o stay busy and structure his day;identifies face timing with daughter and grandson in [place] as a mood booster; identifies church as giving him sense of meaningfulness and belongingness10/26/2021Pt has been working through low mood and low energy in the last several weeks; he has been mostly confined to the house due to a compression fracture which has been very painful; discouraged occasionally by the multiple health challenges he has had to overcome since childhood; states he increased his suboxone does on the advice of Dr Lenis Noon to manage the pain; ran out of suboxone early and asked Dr Sharyon Cable for an early refill; pt was advised to have Dr Lenis Noon call Dr Sharyon Cable and discuss; pt's appt with Dr Sharyon Cable is being rescheduled for today to discuss treatment plan/cordinate care with Dr Wiliam Ke identifies church activities and his faith as what keeps him hopeful and optimistic;pt has been encouraged to attend virtual sober support groups but has not followed through. Pt states that he it takes a lot of determination for him to get his day to day tasks like grocery shopping done and that he does not have the energy to do more than the absolute minimum; pt identifies [facility] as his main support system; states that since he is familiar with everyone on the group it is a comfortable and easy space for him to connect with1/25/2022Pt reports compliance with medication; pt missed monthly UDS last month; aware and notes desire to be compliant; mood is better; continues to be discouraged by low level of motivation to initiate tasks; working on accepting limitations that he identifies as age and health related; started writing a gratitude journal to help him stay positive; pt needs to increase sober support; minimally receptive to attending sober support groups4/20/2022Pt went to visit his daughter while wife was in the [place] pt fell ill while he was at his daughter's and was admitted to ICU; continues to be hospitalized and will be d/c to physical rehab facility; pt will stay in  [place] and move in with daughter when health permits; pt will not be returning to [place] for the foreseeable future; [place]'s [place] [facility]20/2022Patient Stated Goal(s):  To stay sober and be happy and have a sense of purpose...per ptLong Term Goal(s):  Medication compliance/adjustments and Prevent recurrence/exacerbation of symptoms     Target Date:  4/20/2022Short Term Goal/Objective:  Patient will report a decrease in substance use as evidenced by ? self report, -ve UDS and breathalyzers     Target Date: 01/2021     Status:  Mostly achievedShort Term Goal/Objective:  Patient will identify/practice/demonstrate the use of adaptive coping skills as evidenced by ? verbalizing and demonstrating emotional regulation and stress management skills     Target Date: 01/2021     Status:  Partially achievedShort Term Goal/Objective:  Patient will increase their sober support network as evidenced by ? attending online sober support groups 1-2 x weekly     Target Date: 01/2021     Status:  Minimal progressIntervention/Frequency:  Encourage identification/practice/mastery of adaptive coping skills dischargedIntervention/Frequency:  Educate patients on the benefits of joining a support group and utilizing support systems dischargedIntervention/Frequency:  Encourage patient to Identify connection between feeling, thoughts and behaviors dischargedOther Assessed Need(s) (Medical/Psychosocial)(List any other assessed needs that were identified in the assessment of the patient that are NOT included in the Interdisciplinary Problem List noted above.  Indicate what the INTERVENTION, REFERRAL or DEFERRAL will be for each of the other Assessed Needs identified (e.g. name of PCP, other medical provider, therapist, community resource, etc.)Physical and/or medical problems:  pt is hospitalized in [place]  Refer:  pt is hospitalized in [facility] / Psychological Assessments: Medications:Assessment of medication side effects and/or adverse medication reactions is ongoing     Current Outpatient Medications Medication Sig Note ? acetaminophen (TYLENOL) 325 MG tablet Take 650 mg by mouth every 6 (six) hours as needed..  ? buprenorphine-naloxone (SUBOXONE) 4-1 mg per sublingual film Place 1 Film under the tongue daily.  ? buPROPion XL (WELLBUTRIN XL) 300 mg 24 hr tablet Take 1 tablet (300 mg total) by mouth every morning.  ? caffeine 200 mg Tab tablet Take 200 mg by mouth daily.  ? calcium carbonate (TUMS) *200 mg CALCIUM* chewable tablet Take 3 tablets by mouth daily.  ? co-enzyme Q-10 30 mg capsule Take 30 mg by mouth daily.  ? escitalopram oxalate (LEXAPRO) 10 mg tablet Take 1 tablet (10 mg total) by mouth every morning.  ? fish oil-omega-3 fatty acids 1,000 mg capsule Take 2 g by mouth daily.  ? furosemide (LASIX) 40 MG tablet TAKE 1 AND 1/2 TABLETS ORALLY DAILY AS DIRECTED 07/23/2016: Received from: External Pharmacy ? glucosamine-chondroitin 500-400 mg tablet Take 2 tablets by mouth daily.  ? MATZIM LA 360 mg LA 24 hr extended release tablet   ? warfarin (COUMADIN) 6 MG tablet TAKE 1 TABLET BY MOUTH EVERY DAY  No current facility-administered medications for this encounter. Consult / Referral Made:No Consults or Referrals made at this timeDischarge Planning: Anticipated Discharge Date: 4/20/2022Anticipated Discharge Level of Care/ Disposition:Outpatient TherapyPlan: Pt is d/c from [facility] effective 02/08/2021

## 2021-02-08 NOTE — Telephone Encounter
Spoke with pt; pt states he is better, continues to be in the hospital; will be d/c to rehab; will move in with daughter in [place] when health permits; not anticipating returning to [place]; requesting d/c from [facility]

## 2021-03-01 ENCOUNTER — Inpatient Hospital Stay
Admission: RE | Admit: 2021-03-01 | Discharge: 2021-03-22 | Disposition: E | Payer: Medicare Other | Attending: Internal Medicine | Admitting: Internal Medicine

## 2021-03-01 DIAGNOSIS — I509 Heart failure, unspecified: Secondary | ICD-10-CM

## 2021-03-01 DIAGNOSIS — R0603 Acute respiratory distress: Secondary | ICD-10-CM

## 2021-03-01 DIAGNOSIS — J9621 Acute and chronic respiratory failure with hypoxia: Secondary | ICD-10-CM | POA: Diagnosis present

## 2021-03-01 DIAGNOSIS — I482 Chronic atrial fibrillation, unspecified: Secondary | ICD-10-CM | POA: Diagnosis present

## 2021-03-01 DIAGNOSIS — J189 Pneumonia, unspecified organism: Secondary | ICD-10-CM

## 2021-03-01 DIAGNOSIS — R1032 Left lower quadrant pain: Secondary | ICD-10-CM

## 2021-03-01 DIAGNOSIS — J181 Lobar pneumonia, unspecified organism: Secondary | ICD-10-CM | POA: Diagnosis present

## 2021-03-01 DIAGNOSIS — J9690 Respiratory failure, unspecified, unspecified whether with hypoxia or hypercapnia: Secondary | ICD-10-CM

## 2021-03-01 DIAGNOSIS — N183 Chronic kidney disease, stage 3 unspecified: Secondary | ICD-10-CM | POA: Diagnosis present

## 2021-03-01 HISTORY — DX: Chronic kidney disease, stage 3 unspecified: N18.30

## 2021-03-01 HISTORY — DX: Acute and chronic respiratory failure with hypoxia: J96.21

## 2021-03-01 HISTORY — DX: Chronic atrial fibrillation, unspecified: I48.20

## 2021-03-01 HISTORY — DX: Lobar pneumonia, unspecified organism: J18.1

## 2021-03-02 ENCOUNTER — Other Ambulatory Visit (HOSPITAL_COMMUNITY): Payer: Medicare Other

## 2021-03-02 LAB — BASIC METABOLIC PANEL
Anion gap: 9 (ref 5–15)
BUN: 92 mg/dL — ABNORMAL HIGH (ref 8–23)
CO2: 23 mmol/L (ref 22–32)
Calcium: 8.2 mg/dL — ABNORMAL LOW (ref 8.9–10.3)
Chloride: 104 mmol/L (ref 98–111)
Creatinine, Ser: 1.77 mg/dL — ABNORMAL HIGH (ref 0.61–1.24)
GFR, Estimated: 40 mL/min — ABNORMAL LOW (ref >60)
Glucose, Bld: 83 mg/dL (ref 70–99)
Potassium: 5.2 mmol/L — ABNORMAL HIGH (ref 3.5–5.1)
Sodium: 136 mmol/L (ref 135–145)

## 2021-03-02 LAB — CBC
HCT: 27.5 % — ABNORMAL LOW (ref 39.0–52.0)
Hemoglobin: 8.3 g/dL — ABNORMAL LOW (ref 13.0–17.0)
MCH: 27.4 pg (ref 26.0–34.0)
MCHC: 30.2 g/dL (ref 30.0–36.0)
MCV: 90.8 fL (ref 80.0–100.0)
Platelets: 91 10*3/uL — ABNORMAL LOW (ref 150–400)
RBC: 3.03 MIL/uL — ABNORMAL LOW (ref 4.22–5.81)
RDW: 19.2 % — ABNORMAL HIGH (ref 11.5–15.5)
WBC: 21.3 10*3/uL — ABNORMAL HIGH (ref 4.0–10.5)
nRBC: 0.5 % — ABNORMAL HIGH (ref 0.0–0.2)

## 2021-03-03 DIAGNOSIS — J9621 Acute and chronic respiratory failure with hypoxia: Secondary | ICD-10-CM

## 2021-03-03 DIAGNOSIS — I482 Chronic atrial fibrillation, unspecified: Secondary | ICD-10-CM | POA: Diagnosis not present

## 2021-03-03 DIAGNOSIS — N183 Chronic kidney disease, stage 3 unspecified: Secondary | ICD-10-CM

## 2021-03-03 DIAGNOSIS — J181 Lobar pneumonia, unspecified organism: Secondary | ICD-10-CM | POA: Diagnosis not present

## 2021-03-03 LAB — COMPREHENSIVE METABOLIC PANEL
ALT: 67 U/L — ABNORMAL HIGH (ref 0–44)
AST: 41 U/L (ref 15–41)
Albumin: 2.2 g/dL — ABNORMAL LOW (ref 3.5–5.0)
Alkaline Phosphatase: 122 U/L (ref 38–126)
Anion gap: 12 (ref 5–15)
BUN: 92 mg/dL — ABNORMAL HIGH (ref 8–23)
CO2: 20 mmol/L — ABNORMAL LOW (ref 22–32)
Calcium: 8.1 mg/dL — ABNORMAL LOW (ref 8.9–10.3)
Chloride: 102 mmol/L (ref 98–111)
Creatinine, Ser: 1.85 mg/dL — ABNORMAL HIGH (ref 0.61–1.24)
GFR, Estimated: 38 mL/min — ABNORMAL LOW (ref >60)
Glucose, Bld: 286 mg/dL — ABNORMAL HIGH (ref 70–99)
Potassium: 4.8 mmol/L (ref 3.5–5.1)
Sodium: 134 mmol/L — ABNORMAL LOW (ref 135–145)
Total Bilirubin: 0.8 mg/dL (ref 0.3–1.2)
Total Protein: 4.8 g/dL — ABNORMAL LOW (ref 6.5–8.1)

## 2021-03-03 LAB — TRIGLYCERIDES: Triglycerides: 240 mg/dL — ABNORMAL HIGH (ref <150)

## 2021-03-03 LAB — CBC
HCT: 27.1 % — ABNORMAL LOW (ref 39.0–52.0)
Hemoglobin: 8.1 g/dL — ABNORMAL LOW (ref 13.0–17.0)
MCH: 27.5 pg (ref 26.0–34.0)
MCHC: 29.9 g/dL — ABNORMAL LOW (ref 30.0–36.0)
MCV: 91.9 fL (ref 80.0–100.0)
Platelets: 74 10*3/uL — ABNORMAL LOW (ref 150–400)
RBC: 2.95 MIL/uL — ABNORMAL LOW (ref 4.22–5.81)
RDW: 19.3 % — ABNORMAL HIGH (ref 11.5–15.5)
WBC: 9.6 10*3/uL (ref 4.0–10.5)
nRBC: 0.3 % — ABNORMAL HIGH (ref 0.0–0.2)

## 2021-03-03 LAB — HEMOGLOBIN A1C
Hgb A1c MFr Bld: 5.2 % (ref 4.8–5.6)
Mean Plasma Glucose: 102.54 mg/dL

## 2021-03-03 LAB — MAGNESIUM: Magnesium: 2.3 mg/dL (ref 1.7–2.4)

## 2021-03-03 LAB — PHOSPHORUS: Phosphorus: 5.9 mg/dL — ABNORMAL HIGH (ref 2.5–4.6)

## 2021-03-03 NOTE — Consult Note (Signed)
Pulmonary Critical Care Medicine [facility] [place]  PULMONARY SERVICE  Date of Service: 03/03/2021  PULMONARY CRITICAL CARE Larry Franklin  ONG:[id number]  DOB: 12/07/1950   DOA: 03/08/2021  Referring Physician: Carron Curie, MD  HPI: Larry Franklin is a 70 y.o. male seen for follow up of Acute on Chronic Respiratory Failure.  Patient has multiple medical problems including Crohn's disease bowel resection colostomy TPN dependence congestive heart failure atrial fibrillation came into the hospital because of shortness of breath.  Patient was noted to have saturations of 71% on arrival to the ER.  Patient was placed on 15 L oxygen.  The patient had a CT angiogram done also which did not show any pulmonary embolism but there was some concern for pneumonia.  Review of Systems:  ROS performed and is unremarkable other than noted above.  Past medical history Crohn's disease Atrial fibrillation Congestive heart failure Depression Chronic kidney disease Chronic anemia  Past surgical history: Noncontributory  Social history: Reviewed noncontributory to present illness  Family history noncontributory to the present illness  Medications: Reviewed on Rounds  Physical Exam:  Vitals: Temperature is 97.5 pulse 86 respiratory rate is 20 blood pressure is 127/61 saturations 96%  Ventilator Settings off the ventilator on 4 L nasal cannula  . General: Comfortable at this time . Eyes: Grossly normal lids, irises & conjunctiva . ENT: grossly tongue is normal . Neck: no obvious mass . Cardiovascular: Scattered rhonchi expansion is equal . Respiratory: No rhonchi very coarse breath sound . Abdomen: Soft and nontender . Skin: no rash seen on limited exam . Musculoskeletal: not rigid . Psychiatric:unable to assess . Neurologic: no seizure no involuntary movements         Labs on Admission:  Basic Metabolic Panel: Recent Labs  Lab 03/02/21 0533 03/03/21 0352   NA 136 134*  K 5.2* 4.8  CL 104 102  CO2 23 20*  GLUCOSE 83 286*  BUN 92* 92*  CREATININE 1.77* 1.85*  CALCIUM 8.2* 8.1*  MG  --  2.3  PHOS  --  5.9*    No results for input(s): PHART, PCO2ART, PO2ART, HCO3, O2SAT in the last 168 hours.  Liver Function Tests: Recent Labs  Lab 03/03/21 0352  AST 41  ALT 67*  ALKPHOS 122  BILITOT 0.8  PROT 4.8*  ALBUMIN 2.2*   No results for input(s): LIPASE, AMYLASE in the last 168 hours. No results for input(s): AMMONIA in the last 168 hours.  CBC: Recent Labs  Lab 03/02/21 0533 03/03/21 0352  WBC 21.3* 9.6  HGB 8.3* 8.1*  HCT 27.5* 27.1*  MCV 90.8 91.9  PLT 91* 74*    Cardiac Enzymes: No results for input(s): CKTOTAL, CKMB, CKMBINDEX, TROPONINI in the last 168 hours.  BNP (last 3 results) No results for input(s): BNP in the last 8760 hours.  ProBNP (last 3 results) No results for input(s): PROBNP in the last 8760 hours.   Radiological Exams on Admission: DG CHEST PORT 1 VIEW  Result Date: 03/02/2021 CLINICAL DATA:  CHF Respiratory failure EXAM: PORTABLE CHEST 1 VIEW COMPARISON:  None. FINDINGS: Heart mildly enlarged. Mild pulmonary vascular congestion. Multifocal airspace opacities, greatest in the right suprahilar region. Left IJ approach central venous catheter terminates at the cavoatrial junction. IMPRESSION: Constellation of findings most consistent with CHF/fluid volume overload. More focal airspace opacities in the right suprahilar lung and left lung base could also represent superimposed pneumonia. Electronically Signed   By: Larry Franklin M.D.   On:  03/02/2021 13:19    Assessment/Plan Active Problems:   Acute on chronic respiratory failure with hypoxia (HCC)   Chronic atrial fibrillation (HCC)   Chronic kidney disease, stage III (moderate) (HCC)   Lobar pneumonia, unspecified organism (HCC)   1. Acute on chronic respiratory failure hypoxia patient is doing well with oxygen therapy the plan is going to be to  titrate as tolerated we will continue secretion management pulmonary toilet 2. Chronic atrial fibrillation rate now rate is controlled anticoagulated no sign of active bleeding we will continue to monitor. 3. Chronic kidney disease stage III we will continue to follow the patient's renal function follow-up on the hydration status we will continue with supportive care. 4. Lower lobe bar pneumonia patient has been treated with antibiotics right now is afebrile we will continue to follow along  I have personally seen and evaluated the patient, evaluated laboratory and imaging results, formulated the assessment and plan and placed orders. The Patient requires high complexity decision making with multiple systems involvement.  Case was discussed on Rounds with the Respiratory Therapy Director and the Respiratory staff Time Spent  Larry Pax, MD [facility] Pulmonary Critical Care Medicine Sleep Medicine

## 2021-03-04 LAB — DIC (DISSEMINATED INTRAVASCULAR COAGULATION)PANEL
D-Dimer, Quant: 0.98 ug/mL-FEU — ABNORMAL HIGH (ref 0.00–0.50)
Fibrinogen: 381 mg/dL (ref 210–475)
INR: 1.1 (ref 0.8–1.2)
Platelets: 71 10*3/uL — ABNORMAL LOW (ref 150–400)
Prothrombin Time: 14.1 seconds (ref 11.4–15.2)
Smear Review: NONE SEEN
aPTT: 33 seconds (ref 24–36)

## 2021-03-04 LAB — BLOOD GAS, ARTERIAL
Acid-base deficit: 3.2 mmol/L — ABNORMAL HIGH (ref 0.0–2.0)
Bicarbonate: 21.6 mmol/L (ref 20.0–28.0)
FIO2: 32
O2 Saturation: 94.8 %
Patient temperature: 36.5
pCO2 arterial: 39.9 mmHg (ref 32.0–48.0)
pH, Arterial: 7.349 — ABNORMAL LOW (ref 7.350–7.450)
pO2, Arterial: 69.4 mmHg — ABNORMAL LOW (ref 83.0–108.0)

## 2021-03-05 ENCOUNTER — Other Ambulatory Visit (HOSPITAL_COMMUNITY): Payer: Medicare Other

## 2021-03-05 DIAGNOSIS — J181 Lobar pneumonia, unspecified organism: Secondary | ICD-10-CM | POA: Diagnosis not present

## 2021-03-05 DIAGNOSIS — I482 Chronic atrial fibrillation, unspecified: Secondary | ICD-10-CM | POA: Diagnosis not present

## 2021-03-05 DIAGNOSIS — N183 Chronic kidney disease, stage 3 unspecified: Secondary | ICD-10-CM | POA: Diagnosis not present

## 2021-03-05 DIAGNOSIS — J9621 Acute and chronic respiratory failure with hypoxia: Secondary | ICD-10-CM | POA: Diagnosis not present

## 2021-03-05 NOTE — Progress Notes (Signed)
Pulmonary Critical Care Medicine [facility] [place]   PULMONARY CRITICAL CARE SERVICE  PROGRESS NOTE     Sachin Ferencz  ZOX:[id number]  DOB: [birth year]   DOA: 03/13/2021  Referring Physician: Carron Curie, MD  HPI: Larry Franklin is a 70 y.o. male seen for follow up of Acute on Chronic Respiratory Failure.  Patient currently is on the BiPAP seems to be tolerating it well.  Medications: Reviewed on Rounds  Physical Exam:  Vitals: Temperature is 97.1 pulse 118 respiratory 13 blood pressure is 101/64 saturations 100%  Ventilator Settings BiPAP as tolerated  . General: Comfortable at this time . Eyes: Grossly normal lids, irises & conjunctiva . ENT: grossly tongue is normal . Neck: no obvious mass . Cardiovascular: S1 S2 normal no gallop . Respiratory: Scattered rhonchi expansion is equal . Abdomen: soft . Skin: no rash seen on limited exam . Musculoskeletal: not rigid . Psychiatric:unable to assess . Neurologic: no seizure no involuntary movements         Lab Data:   Basic Metabolic Panel: Recent Labs  Lab 03/02/21 0533 03/03/21 0352  NA 136 134*  K 5.2* 4.8  CL 104 102  CO2 23 20*  GLUCOSE 83 286*  BUN 92* 92*  CREATININE 1.77* 1.85*  CALCIUM 8.2* 8.1*  MG  --  2.3  PHOS  --  5.9*    ABG: Recent Labs  Lab 03/04/21 2135  PHART 7.349*  PCO2ART 39.9  PO2ART 69.4*  HCO3 21.6  O2SAT 94.8    Liver Function Tests: Recent Labs  Lab 03/03/21 0352  AST 41  ALT 67*  ALKPHOS 122  BILITOT 0.8  PROT 4.8*  ALBUMIN 2.2*   No results for input(s): LIPASE, AMYLASE in the last 168 hours. No results for input(s): AMMONIA in the last 168 hours.  CBC: Recent Labs  Lab 03/02/21 0533 03/03/21 0352 03/04/21 0440  WBC 21.3* 9.6  --   HGB 8.3* 8.1*  --   HCT 27.5* 27.1*  --   MCV 90.8 91.9  --   PLT 91* 74* 71*    Cardiac Enzymes: No results for input(s): CKTOTAL, CKMB, CKMBINDEX, TROPONINI in the last 168 hours.  BNP (last 3  results) No results for input(s): BNP in the last 8760 hours.  ProBNP (last 3 results) No results for input(s): PROBNP in the last 8760 hours.  Radiological Exams: No results found.  Assessment/Plan Active Problems:   Acute on chronic respiratory failure with hypoxia (HCC)   Chronic atrial fibrillation (HCC)   Chronic kidney disease, stage III (moderate) (HCC)   Lobar pneumonia, unspecified organism (HCC)   1. Acute on chronic respiratory failure hypoxia we will continue with trying to use the BiPAP as tolerated continue secretion management pulmonary toilet. 2. Chronic atrial fibrillation Rate is controlled at this time continue with supportive 3. Chronic kidney disease stage III we will continue with supportive care. 4. Lobar pneumonia treated with antibiotics   I have personally seen and evaluated the patient, evaluated laboratory and imaging results, formulated the assessment and plan and placed orders. The Patient requires high complexity decision making with multiple systems involvement.  Rounds were done with the Respiratory Therapy Director and Staff therapists and discussed with nursing staff also.  Yevonne Pax, MD [facility] Pulmonary Critical Care Medicine Sleep Medicine

## 2021-03-06 DIAGNOSIS — J181 Lobar pneumonia, unspecified organism: Secondary | ICD-10-CM | POA: Diagnosis not present

## 2021-03-06 DIAGNOSIS — J9621 Acute and chronic respiratory failure with hypoxia: Secondary | ICD-10-CM | POA: Diagnosis not present

## 2021-03-06 DIAGNOSIS — N183 Chronic kidney disease, stage 3 unspecified: Secondary | ICD-10-CM | POA: Diagnosis not present

## 2021-03-06 DIAGNOSIS — I482 Chronic atrial fibrillation, unspecified: Secondary | ICD-10-CM | POA: Diagnosis not present

## 2021-03-06 LAB — BASIC METABOLIC PANEL
Anion gap: 7 (ref 5–15)
BUN: 60 mg/dL — ABNORMAL HIGH (ref 8–23)
CO2: 22 mmol/L (ref 22–32)
Calcium: 7.9 mg/dL — ABNORMAL LOW (ref 8.9–10.3)
Chloride: 109 mmol/L (ref 98–111)
Creatinine, Ser: 0.97 mg/dL (ref 0.61–1.24)
GFR, Estimated: >60 mL/min (ref >60)
Glucose, Bld: 132 mg/dL — ABNORMAL HIGH (ref 70–99)
Potassium: 4.2 mmol/L (ref 3.5–5.1)
Sodium: 138 mmol/L (ref 135–145)

## 2021-03-06 LAB — CBC
HCT: 26.9 % — ABNORMAL LOW (ref 39.0–52.0)
Hemoglobin: 8.3 g/dL — ABNORMAL LOW (ref 13.0–17.0)
MCH: 27.3 pg (ref 26.0–34.0)
MCHC: 30.9 g/dL (ref 30.0–36.0)
MCV: 88.5 fL (ref 80.0–100.0)
Platelets: 59 10*3/uL — ABNORMAL LOW (ref 150–400)
RBC: 3.04 MIL/uL — ABNORMAL LOW (ref 4.22–5.81)
RDW: 19.1 % — ABNORMAL HIGH (ref 11.5–15.5)
WBC: 10.7 10*3/uL — ABNORMAL HIGH (ref 4.0–10.5)
nRBC: 0.3 % — ABNORMAL HIGH (ref 0.0–0.2)

## 2021-03-06 LAB — MAGNESIUM: Magnesium: 2.1 mg/dL (ref 1.7–2.4)

## 2021-03-06 LAB — PHOSPHORUS: Phosphorus: 3.7 mg/dL (ref 2.5–4.6)

## 2021-03-06 NOTE — Progress Notes (Signed)
Pulmonary Critical Care Medicine [facility] [place]   PULMONARY CRITICAL CARE SERVICE  PROGRESS NOTE     Larry Franklin  YQI:[id number]  DOB: [birth year]   DOA: 03-18-21  Referring Physician: Carron Curie, MD  HPI: Larry Franklin is a 70 y.o. male seen for follow up of Acute on Chronic Respiratory Failure.  Patient is resting comfortably right now without distress has been on 3 L oxygen  Medications: Reviewed on Rounds  Physical Exam:  Vitals: Temperature is 96.7 pulse 80 respiratory 28 blood pressure 185/92 saturations 96%  Ventilator Settings on 3 L oxygen  . General: Comfortable at this time . Eyes: Grossly normal lids, irises & conjunctiva . ENT: grossly tongue is normal . Neck: no obvious mass . Cardiovascular: S1 S2 normal no gallop . Respiratory: No rhonchi very coarse breath sounds . Abdomen: soft . Skin: no rash seen on limited exam . Musculoskeletal: not rigid . Psychiatric:unable to assess . Neurologic: no seizure no involuntary movements         Lab Data:   Basic Metabolic Panel: Recent Labs  Lab 03/02/21 0533 03/03/21 0352 03/06/21 0645  NA 136 134* 138  K 5.2* 4.8 4.2  CL 104 102 109  CO2 23 20* 22  GLUCOSE 83 286* 132*  BUN 92* 92* 60*  CREATININE 1.77* 1.85* 0.97  CALCIUM 8.2* 8.1* 7.9*  MG  --  2.3 2.1  PHOS  --  5.9* 3.7    ABG: Recent Labs  Lab 03/04/21 2135  PHART 7.349*  PCO2ART 39.9  PO2ART 69.4*  HCO3 21.6  O2SAT 94.8    Liver Function Tests: Recent Labs  Lab 03/03/21 0352  AST 41  ALT 67*  ALKPHOS 122  BILITOT 0.8  PROT 4.8*  ALBUMIN 2.2*   No results for input(s): LIPASE, AMYLASE in the last 168 hours. No results for input(s): AMMONIA in the last 168 hours.  CBC: Recent Labs  Lab 03/02/21 0533 03/03/21 0352 03/04/21 0440 03/06/21 0645  WBC 21.3* 9.6  --  10.7*  HGB 8.3* 8.1*  --  8.3*  HCT 27.5* 27.1*  --  26.9*  MCV 90.8 91.9  --  88.5  PLT 91* 74* 71* 59*    Cardiac Enzymes: No  results for input(s): CKTOTAL, CKMB, CKMBINDEX, TROPONINI in the last 168 hours.  BNP (last 3 results) No results for input(s): BNP in the last 8760 hours.  ProBNP (last 3 results) No results for input(s): PROBNP in the last 8760 hours.  Radiological Exams: DG Abd 1 View  Result Date: 03/05/2021 CLINICAL DATA:  Left lower quadrant pain EXAM: ABDOMEN - 1 VIEW COMPARISON:  Same day chest radiograph FINDINGS: Relative paucity of abdominal bowel gas without evidence of obstruction. Metallic clips overlie the right lower quadrant. Partially visualized central venous catheter with tip overlying the inferior vena cavasimilar prior chest radiograph. Bibasilar reticular opacities possibly representing edema superimposed on fibrosis. IMPRESSION: No evidence of obstruction. Electronically Signed   By: Maudry Mayhew MD   On: 03/05/2021 20:39   DG CHEST PORT 1 VIEW  Result Date: 03/05/2021 CLINICAL DATA:  Respiratory failure EXAM: PORTABLE CHEST 1 VIEW COMPARISON:  Mar 02, 2021 FINDINGS: Central catheter tip is at the inferior vena cava-right atrium junction. No pneumothorax. There is again noted cardiomegaly with pulmonary venous hypertension. There is bilateral apical pleural thickening. Interstitium is prominent, likely with a degree of interstitial pulmonary edema, potentially superimposed on a degree of fibrosis. Equivocal right pleural effusion noted. No airspace consolidation. IMPRESSION: Central catheter  tip again noted at inferior vena cava-right atrium junction. Stable cardiomegaly with pulmonary vascular congestion. Suspect mild interstitial edema, potentially superimposed on a degree of underlying fibrosis. Stable apical pleural thickening bilaterally. Equivocal right pleural effusion. Appearance overall similar to recent study. Electronically Signed   By: Bretta Bang III M.D.   On: 03/05/2021 17:38    Assessment/Plan Active Problems:   Acute on chronic respiratory failure with hypoxia  (HCC)   Chronic atrial fibrillation (HCC)   Chronic kidney disease, stage III (moderate) (HCC)   Lobar pneumonia, unspecified organism (HCC)   1. Acute on chronic respiratory failure with hypoxia doing well with the oxygen levels will titrate as tolerated. 2. Chronic atrial fibrillation rate is controlled 3. Chronic kidney disease stage III we will continue to follow labs 4. Lobar pneumonia treated   I have personally seen and evaluated the patient, evaluated laboratory and imaging results, formulated the assessment and plan and placed orders. The Patient requires high complexity decision making with multiple systems involvement.  Rounds were done with the Respiratory Therapy Director and Staff therapists and discussed with nursing staff also.  Yevonne Pax, MD [facility] Pulmonary Critical Care Medicine Sleep Medicine

## 2021-03-08 ENCOUNTER — Encounter: Payer: Self-pay | Admitting: Internal Medicine

## 2021-03-08 DIAGNOSIS — J181 Lobar pneumonia, unspecified organism: Secondary | ICD-10-CM | POA: Diagnosis present

## 2021-03-08 DIAGNOSIS — J9621 Acute and chronic respiratory failure with hypoxia: Secondary | ICD-10-CM | POA: Diagnosis present

## 2021-03-08 DIAGNOSIS — I482 Chronic atrial fibrillation, unspecified: Secondary | ICD-10-CM | POA: Diagnosis present

## 2021-03-08 DIAGNOSIS — N183 Chronic kidney disease, stage 3 unspecified: Secondary | ICD-10-CM | POA: Diagnosis present

## 2021-03-09 ENCOUNTER — Other Ambulatory Visit (HOSPITAL_COMMUNITY): Payer: Medicare Other

## 2021-03-09 DIAGNOSIS — J181 Lobar pneumonia, unspecified organism: Secondary | ICD-10-CM | POA: Diagnosis not present

## 2021-03-09 DIAGNOSIS — I482 Chronic atrial fibrillation, unspecified: Secondary | ICD-10-CM | POA: Diagnosis not present

## 2021-03-09 DIAGNOSIS — N183 Chronic kidney disease, stage 3 unspecified: Secondary | ICD-10-CM | POA: Diagnosis not present

## 2021-03-09 DIAGNOSIS — J9621 Acute and chronic respiratory failure with hypoxia: Secondary | ICD-10-CM | POA: Diagnosis not present

## 2021-03-09 LAB — BLOOD GAS, ARTERIAL
Acid-base deficit: 9.8 mmol/L — ABNORMAL HIGH (ref 0.0–2.0)
Acid-base deficit: 10.5 mmol/L — ABNORMAL HIGH (ref 0.0–2.0)
Bicarbonate: 16.6 mmol/L — ABNORMAL LOW (ref 20.0–28.0)
Bicarbonate: 16.8 mmol/L — ABNORMAL LOW (ref 20.0–28.0)
FIO2: 100
FIO2: 60
O2 Saturation: 85.8 %
O2 Saturation: 95.6 %
Patient temperature: 37
Patient temperature: 36.8
pCO2 arterial: 42.7 mmHg (ref 32.0–48.0)
pCO2 arterial: 49.9 mmHg — ABNORMAL HIGH (ref 32.0–48.0)
pH, Arterial: 7.213 — ABNORMAL LOW (ref 7.350–7.450)
pH, Arterial: 7.152 — CL (ref 7.350–7.450)
pO2, Arterial: 60.4 mmHg — ABNORMAL LOW (ref 83.0–108.0)
pO2, Arterial: 92.3 mmHg (ref 83.0–108.0)

## 2021-03-09 LAB — CBC
HCT: 30.5 % — ABNORMAL LOW (ref 39.0–52.0)
Hemoglobin: 9.1 g/dL — ABNORMAL LOW (ref 13.0–17.0)
MCH: 27.4 pg (ref 26.0–34.0)
MCHC: 29.8 g/dL — ABNORMAL LOW (ref 30.0–36.0)
MCV: 91.9 fL (ref 80.0–100.0)
Platelets: 103 10*3/uL — ABNORMAL LOW (ref 150–400)
RBC: 3.32 MIL/uL — ABNORMAL LOW (ref 4.22–5.81)
RDW: 19.1 % — ABNORMAL HIGH (ref 11.5–15.5)
WBC: 26.5 10*3/uL — ABNORMAL HIGH (ref 4.0–10.5)
nRBC: 1.6 % — ABNORMAL HIGH (ref 0.0–0.2)

## 2021-03-09 LAB — BASIC METABOLIC PANEL
Anion gap: 8 (ref 5–15)
BUN: 109 mg/dL — ABNORMAL HIGH (ref 8–23)
CO2: 17 mmol/L — ABNORMAL LOW (ref 22–32)
Calcium: 8.1 mg/dL — ABNORMAL LOW (ref 8.9–10.3)
Chloride: 108 mmol/L (ref 98–111)
Creatinine, Ser: 1.48 mg/dL — ABNORMAL HIGH (ref 0.61–1.24)
GFR, Estimated: 50 mL/min — ABNORMAL LOW (ref >60)
Glucose, Bld: 89 mg/dL (ref 70–99)
Potassium: 5 mmol/L (ref 3.5–5.1)
Sodium: 133 mmol/L — ABNORMAL LOW (ref 135–145)

## 2021-03-09 LAB — PHOSPHORUS: Phosphorus: 4.8 mg/dL — ABNORMAL HIGH (ref 2.5–4.6)

## 2021-03-09 LAB — MAGNESIUM: Magnesium: 2.1 mg/dL (ref 1.7–2.4)

## 2021-03-09 NOTE — Progress Notes (Signed)
Pulmonary Critical Care Medicine [facility] [place]   PULMONARY CRITICAL CARE SERVICE  PROGRESS NOTE     Larry Franklin  [id number]:[id number]  DOB: 24-Dec-1950   DOA: 02/26/2021  Referring Physician: Carron Curie, MD  HPI: Larry Franklin is a 70 y.o. male seen for follow up of Acute on Chronic Respiratory Failure.  Patient this morning was found on 100% nonrebreather apparently he is having behavioral issues where he continues to rip off his oxygen and so was put on 100% nonrebreather.  Right now appears to be calm discussed on rounds placing him on Oxymizer which he seems to tolerate fine  Medications: Reviewed on Rounds  Physical Exam:  Vitals: Temperature is 98.6 pulse 92 respiratory 22 blood pressure is 164/78 saturations 89%  Ventilator Settings off the ventilator on nonrebreather refuses to use BiPAP  . General: Comfortable at this time . Eyes: Grossly normal lids, irises & conjunctiva . ENT: grossly tongue is normal . Neck: no obvious mass . Cardiovascular: S1 S2 normal no gallop . Respiratory: No rhonchi coarse breath sounds are noted diminished overall . Abdomen: soft . Skin: no rash seen on limited exam . Musculoskeletal: not rigid . Psychiatric:unable to assess . Neurologic: no seizure no involuntary movements         Lab Data:   Basic Metabolic Panel: Recent Labs  Lab 03/03/21 0352 03/06/21 0645 03/09/21 0453  NA 134* 138  --   K 4.8 4.2  --   CL 102 109  --   CO2 20* 22  --   GLUCOSE 286* 132*  --   BUN 92* 60*  --   CREATININE 1.85* 0.97  --   CALCIUM 8.1* 7.9*  --   MG 2.3 2.1 2.1  PHOS 5.9* 3.7 4.8*    ABG: Recent Labs  Lab 03/04/21 2135  PHART 7.349*  PCO2ART 39.9  PO2ART 69.4*  HCO3 21.6  O2SAT 94.8    Liver Function Tests: Recent Labs  Lab 03/03/21 0352  AST 41  ALT 67*  ALKPHOS 122  BILITOT 0.8  PROT 4.8*  ALBUMIN 2.2*   No results for input(s): LIPASE, AMYLASE in the last 168 hours. No results for input(s):  AMMONIA in the last 168 hours.  CBC: Recent Labs  Lab 03/03/21 0352 03/04/21 0440 03/06/21 0645  WBC 9.6  --  10.7*  HGB 8.1*  --  8.3*  HCT 27.1*  --  26.9*  MCV 91.9  --  88.5  PLT 74* 71* 59*    Cardiac Enzymes: No results for input(s): CKTOTAL, CKMB, CKMBINDEX, TROPONINI in the last 168 hours.  BNP (last 3 results) No results for input(s): BNP in the last 8760 hours.  ProBNP (last 3 results) No results for input(s): PROBNP in the last 8760 hours.  Radiological Exams: No results found.  Assessment/Plan Active Problems:   Acute on chronic respiratory failure with hypoxia (HCC)   Chronic atrial fibrillation (HCC)   Chronic kidney disease, stage III (moderate) (HCC)   Lobar pneumonia, unspecified organism (HCC)   1. Acute on chronic respiratory failure hypoxia we will continue with trying to wean the oxygen down the patient will go on an Oxymizer with modalities and see how he does. 2. Chronic atrial fibrillation rate is controlled 3. Chronic kidney disease stage III we will continue with supportive care 4. Lobar pneumonia has been treated Is improving   I have personally seen and evaluated the patient, evaluated laboratory and imaging results, formulated the assessment and plan and placed orders.  The Patient requires high complexity decision making with multiple systems involvement.  Rounds were done with the Respiratory Therapy Director and Staff therapists and discussed with nursing staff also.  Allyne Gee, MD [facility] Pulmonary Critical Care Medicine Sleep Medicine

## 2021-03-09 NOTE — Consult Note (Signed)
Infectious Disease Consultation   Larry Franklin  MEQ:[id number]  DOB: Feb 19, 1951  DOA: 03-05-21  Requesting physician: Dr. Sharyon Medicus  Reason for consultation: Antibiotic recommendations   History of Present Illness: Larry Franklin is an 70 y.o. male with history of Crohn's disease status post bowel resection, colostomy, TPN dependence, CHF, atrial fibrillation, who presented to [facility] on 01/21/2021 with dyspnea.  He was found to have worsening hypoxemia with increasing oxygen requirements.  He was found to have pneumonia and started on broad-spectrum antimicrobials.  However, sputum culture showed MSSA, Pseudomonas therefore was switched to Ancef and ciprofloxacin.  He was initially started on IV vancomycin, cefepime and was later narrowed down to Ancef and ciprofloxacin.  On 01/25/2021 he required intubation due to worsening hypoxemia.  He had bronchoscopy done and was found to have diffuse alveolar hemorrhage.  BAL was negative for bacterial, AFB, fungus and PCP.  CT of the abdomen pelvis was concerning for flareup of Crohn's disease.  Rheumatology evaluated the patient and he received treatment with prednisone.  However, extensive work-up for autoimmune disease was negative.  He was started on steroid taper and was transferred to the floor on 02/02/2021 but had increasing O2 requirements with pulmonary edema therefore transferred back to the ICU on 02/15/2021 with noninvasive pressure ventilation and diuresis.  He was eventually intubated but later extubated and transferred to the floor.  His hospital course complicated by cystitis for which he received treatment with ceftriaxone.  He has been TPN dependent because of his Crohn's disease and is status post bowel resection.  Hospital course also complicated by acute on chronic CKD stage III.  Due to his complex medical problems he was transferred and admitted to [facility].  Patient at this time is having  worsening confusion, worsening shortness of breath.  On BiPAP.  Review of Systems:  He is very confused.  Unable to obtain review of systems at this time.  Past Medical History: Past Medical History:  Diagnosis Date  . Acute on chronic respiratory failure with hypoxia (HCC)   . Chronic atrial fibrillation (HCC)   . Chronic kidney disease, stage III (moderate) (HCC)   . Lobar pneumonia, unspecified organism [facility])     Past Surgical History: History of bowel resection in the past secondary to the Crohn's disease, colostomy  Allergies: No known allergies  Social History: Past history of tobacco use, no other alcohol or recreational drug use history  Family History: Unable to obtain at this time  Physical Exam: Vitals: Temperature 97.3, pulse 76, respiratory rate 18, blood pressure 164/71, pulse oximetry 93% Constitutional: Ill-appearing male, confused, on BiPAP. Head: Atraumatic, normocephalic Eyes: PERLA, EOMI ENMT: external ears and nose appear normal, normal hearing, moist oral mucosa Neck: Supple CVS: S1-S2  Respiratory: Decreased breath sound lower lobes, occasional rhonchi, no wheezing Abdomen: Soft, positive bowel sounds, mild tenderness without any guarding or rebound, has ostomy Musculoskeletal: No edema Neuro: Confused, not following commands.  Unable to do neurologic exam at this time Psych: Confused  Skin: Unstageable sacral pressure ulcer  Data reviewed:  I have personally reviewed following labs and imaging studies Labs:  CBC: Recent Labs  Lab 03/03/21 0352 03/04/21 0440 03/06/21 0645  WBC 9.6  --  10.7*  HGB 8.1*  --  8.3*  HCT 27.1*  --  26.9*  MCV 91.9  --  88.5  PLT 74* 71* 59*    Basic Metabolic Panel: Recent Labs  Lab 03/03/21 0352 03/06/21 0645 03/09/21 0453  NA 134* 138  --   K 4.8 4.2  --   CL 102 109  --   CO2 20* 22  --   GLUCOSE 286* 132*  --   BUN 92* 60*  --   CREATININE 1.85* 0.97  --   CALCIUM 8.1* 7.9*  --   MG 2.3 2.1  2.1  PHOS 5.9* 3.7 4.8*   GFR CrCl cannot be calculated (Unknown ideal weight.). Liver Function Tests: Recent Labs  Lab 03/03/21 0352  AST 41  ALT 67*  ALKPHOS 122  BILITOT 0.8  PROT 4.8*  ALBUMIN 2.2*   No results for input(s): LIPASE, AMYLASE in the last 168 hours. No results for input(s): AMMONIA in the last 168 hours. Coagulation profile Recent Labs  Lab 03/04/21 0440  INR 1.1    Cardiac Enzymes: No results for input(s): CKTOTAL, CKMB, CKMBINDEX, TROPONINI in the last 168 hours. BNP: Invalid input(s): POCBNP CBG: No results for input(s): GLUCAP in the last 168 hours. D-Dimer No results for input(s): DDIMER in the last 72 hours. Hgb A1c No results for input(s): HGBA1C in the last 72 hours. Lipid Profile No results for input(s): CHOL, HDL, LDLCALC, TRIG, CHOLHDL, LDLDIRECT in the last 72 hours. Thyroid function studies No results for input(s): TSH, T4TOTAL, T3FREE, THYROIDAB in the last 72 hours.  Invalid input(s): FREET3 Anemia work up No results for input(s): VITAMINB12, FOLATE, FERRITIN, TIBC, IRON, RETICCTPCT in the last 72 hours. Urinalysis No results found for: COLORURINE, APPEARANCEUR, LABSPEC, PHURINE, GLUCOSEU, HGBUR, BILIRUBINUR, KETONESUR, PROTEINUR, UROBILINOGEN, NITRITE, LEUKOCYTESUR   Sepsis Labs Invalid input(s): PROCALCITONIN,  WBC,  LACTICIDVEN Microbiology No results found for this or any previous visit (from the past 240 hour(s)).  Inpatient Medications:   Please see MAR  Radiological Exams on Admission: DG CHEST PORT 1 VIEW  Result Date: 03/09/2021 CLINICAL DATA:  Short of breath EXAM: PORTABLE CHEST 1 VIEW COMPARISON:  Radiograph 03/05/2021 FINDINGS: Central line with the tip at the inferior vena cava/atrial junction. Stable cardiac silhouette. There is increase in fine airspace disease compared to prior. Small effusions. IMPRESSION: 1. Increase in pulmonary edema pattern. 2. Central venous line unchanged. Electronically Signed   By:  Genevive Bi M.D.   On: 03/09/2021 10:39    Impression/Recommendations Active Problems:   Acute on chronic respiratory failure with hypoxia (HCC) Systemic inflammatory response syndrome Pneumonia with MSSA, Pseudomonas Sacral pressure ulcer unstageable UTI Thrombocytopenia   Chronic atrial fibrillation (HCC)   Chronic kidney disease, stage III (moderate) (HCC) Crohn's disease status post surgery with ostomy, TPN dependent  Acute on chronic respiratory failure with hypoxemia: Patient was intubated at the acute facility.  He had respiratory cultures at the acute facility that apparently showed MSSA, Pseudomonas.  He was treated initially with broad-spectrum antimicrobials and subsequently switched to Ancef, ciprofloxacin which she completed.  Unfortunately now having worsening respiratory failure and on BiPAP.  ABG showing acidosis.  Suggest to start him on IV cefepime, Flagyl.  Also recommend to send for respiratory cultures, repeat chest imaging preferably chest CT which can be done without contrast if concern for renal compromise.  He also may be aspirating.  Please monitor CBC, BUN/creatinine closely.  Follow-up on the cultures and adjust antibiotics accordingly. -He was on inhaled pentamadine from the previous facility.  Here started on Bactrim for PCP prophylaxis while on the steroids at the acute facility.  No evidence of PCP at the acute facility on the BAL per records.  Systemic  inflammatory response syndrome: Patient having worsening hypoxemia, concern for SIRS/pneumonia versus other systemic infection.  Suggest to send for pancultures.  Suggest to start him on empiric antibiotics as mentioned above.  Follow-up on the cultures and adjust antibiotics accordingly.  Sacral pressure ulcer: He had unstageable sacral pressure ulcer at the acute facility.  Continue local wound care.  Now concern for SIRS.  Suggest to start him on empiric antibiotics as mentioned above.  If his pressure ulcer  is worsening consider consulting surgery for debridement.  UTI: He had UTI at the acute facility and already treated with antibiotics.  Now having decreased urinary output per primary team.  Suggest to send for pan cultures as mentioned above and start him on empiric antibiotics as mentioned above.  Thrombocytopenia: Continue to monitor platelet count.  Chronic atrial fibrillation: Continue medication and management per primary team.  History of Crohn's disease status post surgery with ostomy/TPN dependent: Unfortunately patient is TPN dependent therefore high risk for fungemia.  Starting empiric antibiotics as mentioned above.  Also recommend to send for cultures.  If he is not improving suggest to add fluconazole for empiric antifungal coverage.  Chronic kidney disease stage III: Please monitor BUN/creatinine closely while on the antibiotics.  He had acute on chronic renal insufficiency at the acute facility.  Avoid nephrotoxic medication.  Further management per primary team.  Encephalopathy: Likely toxic/metabolic.  Patient currently very confused and hypoxemic.  On BiPAP.  Recommend empiric antibiotics, send cultures and consider imaging as mentioned above.  Continue supportive management per primary team.  Unfortunately due to his complex medical problems he is very high risk for worsening and decompensation.  Thank you for this consultation.  Plan of care discussed with the primary team and pharmacy.  Vonzella Nipple M.D. 03/09/2021, 5:28 PM

## 2021-03-10 ENCOUNTER — Other Ambulatory Visit (HOSPITAL_COMMUNITY): Payer: Medicare Other

## 2021-03-10 LAB — BLOOD GAS, ARTERIAL
Acid-base deficit: 13.7 mmol/L — ABNORMAL HIGH (ref 0.0–2.0)
Bicarbonate: 14.4 mmol/L — ABNORMAL LOW (ref 20.0–28.0)
FIO2: 70
O2 Saturation: 90.3 %
Patient temperature: 36.4
pCO2 arterial: 48.6 mmHg — ABNORMAL HIGH (ref 32.0–48.0)
pH, Arterial: 7.094 — CL (ref 7.350–7.450)
pO2, Arterial: 67.9 mmHg — ABNORMAL LOW (ref 83.0–108.0)

## 2021-03-10 LAB — URINALYSIS, ROUTINE W REFLEX MICROSCOPIC
Bilirubin Urine: NEGATIVE
Glucose, UA: NEGATIVE mg/dL
Ketones, ur: NEGATIVE mg/dL
Nitrite: NEGATIVE
Protein, ur: NEGATIVE mg/dL
Specific Gravity, Urine: 1.008 (ref 1.005–1.030)
WBC, UA: >50 WBC/hpf — ABNORMAL HIGH (ref 0–5)
pH: 5 (ref 5.0–8.0)

## 2021-03-10 LAB — BASIC METABOLIC PANEL
Anion gap: 10 (ref 5–15)
BUN: 130 mg/dL — ABNORMAL HIGH (ref 8–23)
CO2: 14 mmol/L — ABNORMAL LOW (ref 22–32)
Calcium: 8 mg/dL — ABNORMAL LOW (ref 8.9–10.3)
Chloride: 107 mmol/L (ref 98–111)
Creatinine, Ser: 1.67 mg/dL — ABNORMAL HIGH (ref 0.61–1.24)
GFR, Estimated: 43 mL/min — ABNORMAL LOW (ref >60)
Glucose, Bld: 251 mg/dL — ABNORMAL HIGH (ref 70–99)
Potassium: 5.1 mmol/L (ref 3.5–5.1)
Sodium: 131 mmol/L — ABNORMAL LOW (ref 135–145)

## 2021-03-10 LAB — TRIGLYCERIDES: Triglycerides: 81 mg/dL (ref <150)

## 2021-03-10 LAB — CBC
HCT: 28.6 % — ABNORMAL LOW (ref 39.0–52.0)
Hemoglobin: 8.3 g/dL — ABNORMAL LOW (ref 13.0–17.0)
MCH: 26.9 pg (ref 26.0–34.0)
MCHC: 29 g/dL — ABNORMAL LOW (ref 30.0–36.0)
MCV: 92.6 fL (ref 80.0–100.0)
Platelets: 79 10*3/uL — ABNORMAL LOW (ref 150–400)
RBC: 3.09 MIL/uL — ABNORMAL LOW (ref 4.22–5.81)
RDW: 19.2 % — ABNORMAL HIGH (ref 11.5–15.5)
WBC: 20.1 10*3/uL — ABNORMAL HIGH (ref 4.0–10.5)
nRBC: 1.1 % — ABNORMAL HIGH (ref 0.0–0.2)

## 2021-03-11 ENCOUNTER — Other Ambulatory Visit: Payer: Self-pay

## 2021-03-11 ENCOUNTER — Other Ambulatory Visit (HOSPITAL_COMMUNITY): Payer: Medicare Other

## 2021-03-11 LAB — CBC
HCT: 27.5 % — ABNORMAL LOW (ref 39.0–52.0)
Hemoglobin: 7.7 g/dL — ABNORMAL LOW (ref 13.0–17.0)
MCH: 27.1 pg (ref 26.0–34.0)
MCHC: 28 g/dL — ABNORMAL LOW (ref 30.0–36.0)
MCV: 96.8 fL (ref 80.0–100.0)
Platelets: 83 10*3/uL — ABNORMAL LOW (ref 150–400)
RBC: 2.84 MIL/uL — ABNORMAL LOW (ref 4.22–5.81)
RDW: 19.2 % — ABNORMAL HIGH (ref 11.5–15.5)
WBC: 17.7 10*3/uL — ABNORMAL HIGH (ref 4.0–10.5)
nRBC: 2.8 % — ABNORMAL HIGH (ref 0.0–0.2)

## 2021-03-11 LAB — URINE CULTURE: Culture: 60000 — AB

## 2021-03-11 LAB — BASIC METABOLIC PANEL
Anion gap: 10 (ref 5–15)
BUN: 156 mg/dL — ABNORMAL HIGH (ref 8–23)
CO2: 11 mmol/L — ABNORMAL LOW (ref 22–32)
Calcium: 7.9 mg/dL — ABNORMAL LOW (ref 8.9–10.3)
Chloride: 107 mmol/L (ref 98–111)
Creatinine, Ser: 2.28 mg/dL — ABNORMAL HIGH (ref 0.61–1.24)
GFR, Estimated: 30 mL/min — ABNORMAL LOW (ref >60)
Glucose, Bld: 419 mg/dL — ABNORMAL HIGH (ref 70–99)
Potassium: 6.3 mmol/L (ref 3.5–5.1)
Sodium: 128 mmol/L — ABNORMAL LOW (ref 135–145)

## 2021-03-11 LAB — BLOOD GAS, ARTERIAL
Acid-base deficit: 20.9 mmol/L — ABNORMAL HIGH (ref 0.0–2.0)
Bicarbonate: 12.3 mmol/L — ABNORMAL LOW (ref 20.0–28.0)
FIO2: 100
O2 Saturation: 67.5 %
Patient temperature: 37
pCO2 arterial: 99.9 mmHg (ref 32.0–48.0)
pH, Arterial: <6.8 — CL (ref 7.350–7.450)
pO2, Arterial: 51.9 mmHg — ABNORMAL LOW (ref 83.0–108.0)

## 2021-03-11 LAB — POTASSIUM: Potassium: >7.5 mmol/L (ref 3.5–5.1)

## 2021-03-11 LAB — GLUCOSE, RANDOM: Glucose, Bld: 462 mg/dL — ABNORMAL HIGH (ref 70–99)

## 2021-03-15 LAB — CULTURE, BLOOD (ROUTINE X 2)
Culture: NO GROWTH
Culture: NO GROWTH

## 2021-03-22 DEATH — deceased

## 2021-05-23 NOTE — Group Note
Group Session:  Alcohol/Substance Abuse RecoveryTELEHEALTH CLINICIAN/GROUP: This clinician is part of the telehealth program and is conducting this visit in a currently approved location.-  For this visit the clinician and patient were present via:   interactive audio and video telecommunications system that permits real-time communications-  Consent was signed via the Patient Acknowledgement and Financial Authorization Form and the Ambulatory Telehealth Consent Form-  State patient is located in: [place]-  The clinician is appropriately licensed in the above state to provide care for this visit.-  Clinician is physically located on site-  Other individuals present during the telehealth encounter and their role/relation: group peers-  Start time: 9:00am      Stop time:  10:30am     Total time spent in medical video consultation: 90 minutes          (Total time spent by the provider on the day of service, which includes time spent on chart review, medical video consultation, education, coordination of care/services and counseling)Facilitators:  Jeanella Cara, LCSWSession Focus Relapse prevention Number of Participants 5 Treatment Modality Group Therapy Interventions/Education Relapse Prevention InterventionsHelped patient become skilled in recognizing/process/coping with thoughts/feelingsIdentified and deactivated triggers to drug/alcohol useIdentified alternative activities to structure leisure timeIdentified positive outcomes for refraining from substancesStages of Change InterventionsFamily Dynamics InterventionsSubstance Abuse Recovery InterventionsEncouraged processing of thoughts/feelings/actions related to substance abuseEncouraged patient to identify adaptive ways of coping with negative feeling statesIncreased the ability for self-observation Attendance            AttendedEngagement InterventionsNot applicable - patient attended group without difficulty Degree of Participation Active Behavior Alert and Appropriate Speech/Thought Process Coherent and Organized Affect/Mood Full range and Stable Insight Good Judgment Good Response to Interventions Engaged, Interested and Receptive Individualization Group members checked in about status of abstinence/sobriety, any recent stressors or triggers they have experienced - and how they have been coping with these, if applicable.Tyler Khan reported continued abstinence.He shared that his back is still out and that he is feeling exasperated with the slow healing process ?it?s been 8 weeks already.? He said he recognizes that healing is occurring, but that it is frustrating to not yet feel as well as he would like to.Pt said he will see Dr. Sharyon Cable today at 3pm and that he noted he still has some buprenorphine left over (pt questioned whether that may indicate that his pain is starting to improve if he is needing less medication). He observed that he can?t really tell whether the pain is improving or whether he is just growing accustomed to it.Tyler Khan said that he needs to follow up with Dr. Lenis Noon about managing osteoporosis, as well, as that condition will also impact mobility and quality of life. Pt said he is trying to ?get into the Christmas mood? - although it has been somewhat difficult. He noted that he sent holiday cards and packages out yesterday and that he plans to attend candlelight worship services on Christmas Eve. Pt added that he will Facetime with loved ones on Christmas Day. Plan Continue to assist patient with relapse preventionContinue per Plan of Care / Interdisciplinary Plan of Care

## 2022-01-30 IMAGING — DX DG CHEST 1V PORT
1 series · 1 of 1 positions shown · non-contrast
Comparison: March 02, 2021

CLINICAL DATA: Respiratory failure

EXAM:
PORTABLE CHEST 1 VIEW

[chest]
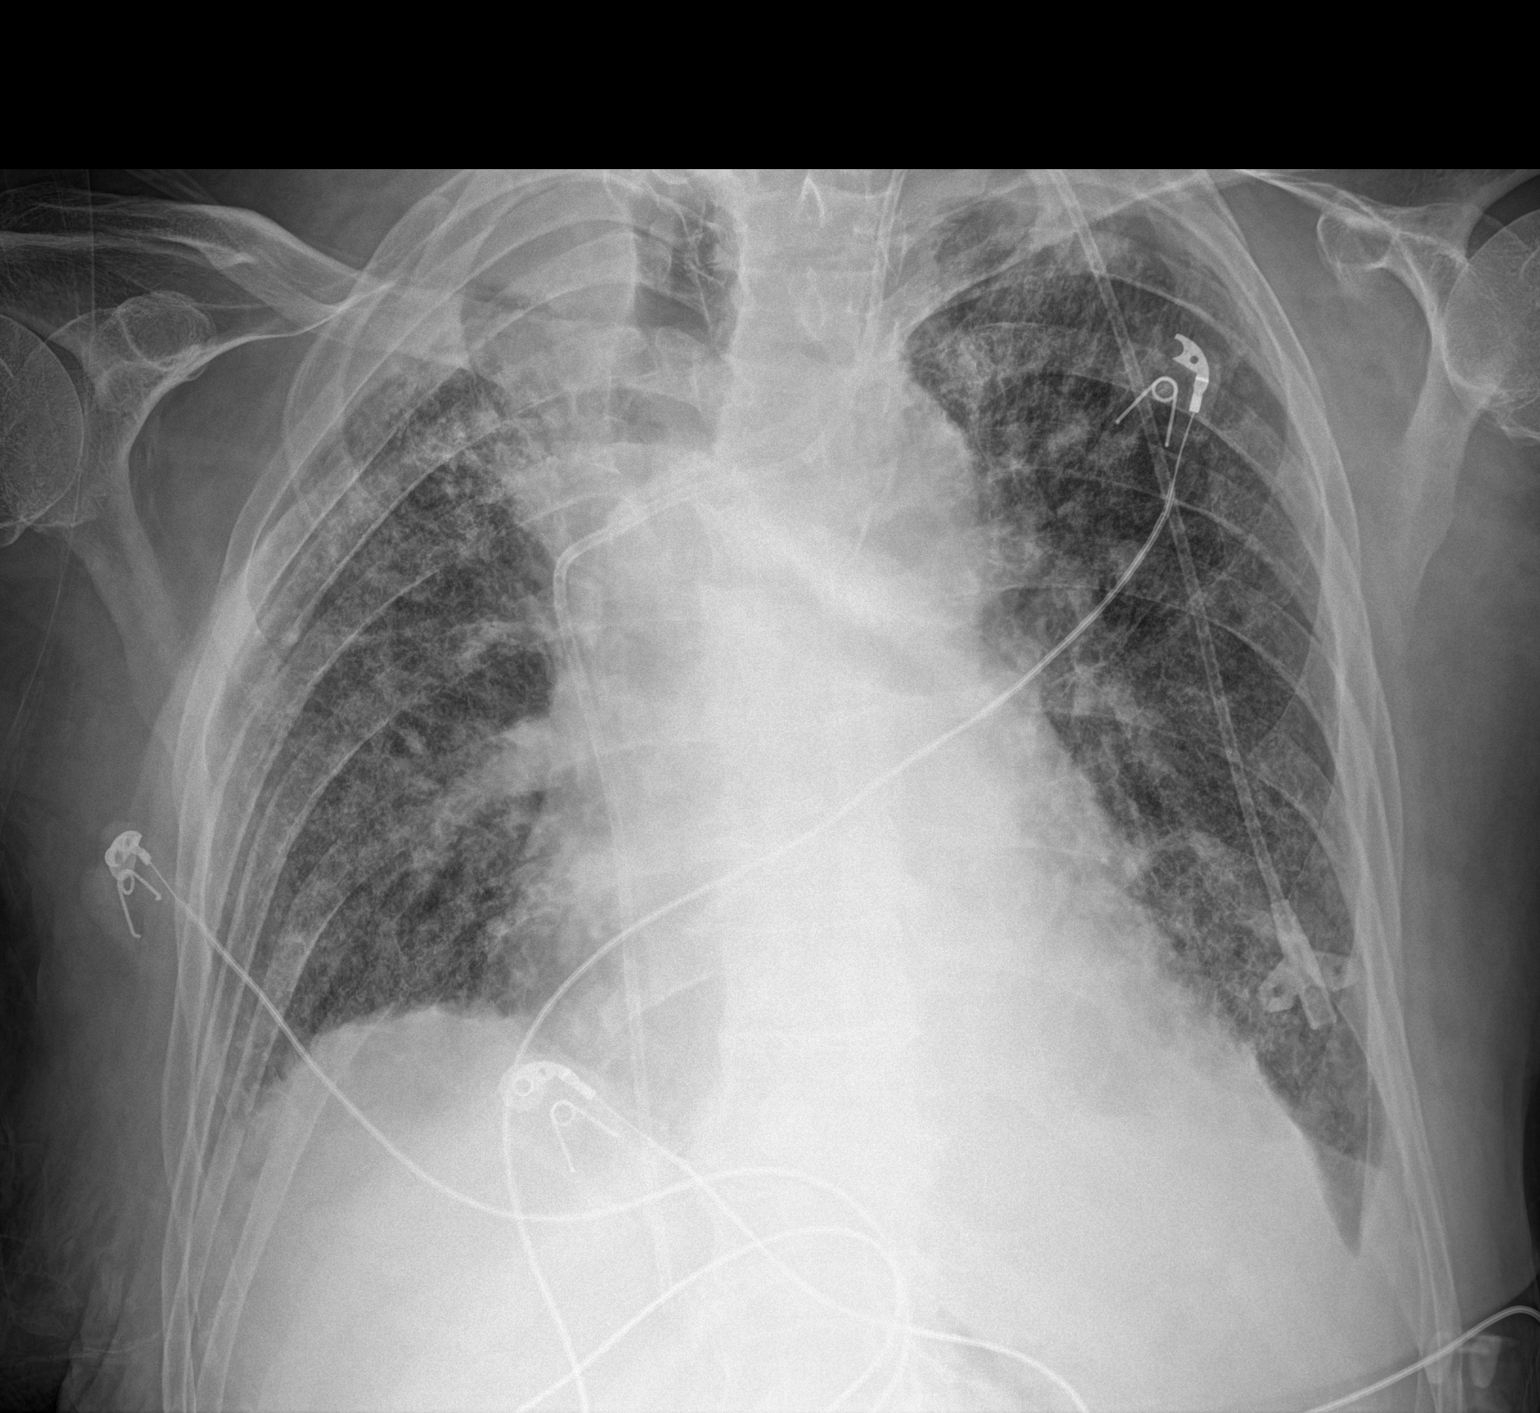

[1 of 1 positions shown; findings below may reference images not displayed]

FINDINGS: Central catheter tip is at the inferior vena cava-right atrium
junction. No pneumothorax. There is again noted cardiomegaly with
pulmonary venous hypertension. There is bilateral apical pleural
thickening. Interstitium is prominent, likely with a degree of
interstitial pulmonary edema, potentially superimposed on a degree
of fibrosis. Equivocal right pleural effusion noted. No airspace
consolidation.
IMPRESSION: Central catheter tip again noted at inferior vena cava-right atrium
junction. Stable cardiomegaly with pulmonary vascular congestion.
Suspect mild interstitial edema, potentially superimposed on a
degree of underlying fibrosis. Stable apical pleural thickening
bilaterally. Equivocal right pleural effusion. Appearance overall
similar to recent study.

## 2022-02-04 IMAGING — DX DG CHEST 1V PORT
1 series · 1 of 1 positions shown · non-contrast
Comparison: Portable chest 03/09/2021 and earlier.

CLINICAL DATA: 70-year-old male with respiratory distress.

EXAM:
PORTABLE CHEST 1 VIEW

[chest ap]
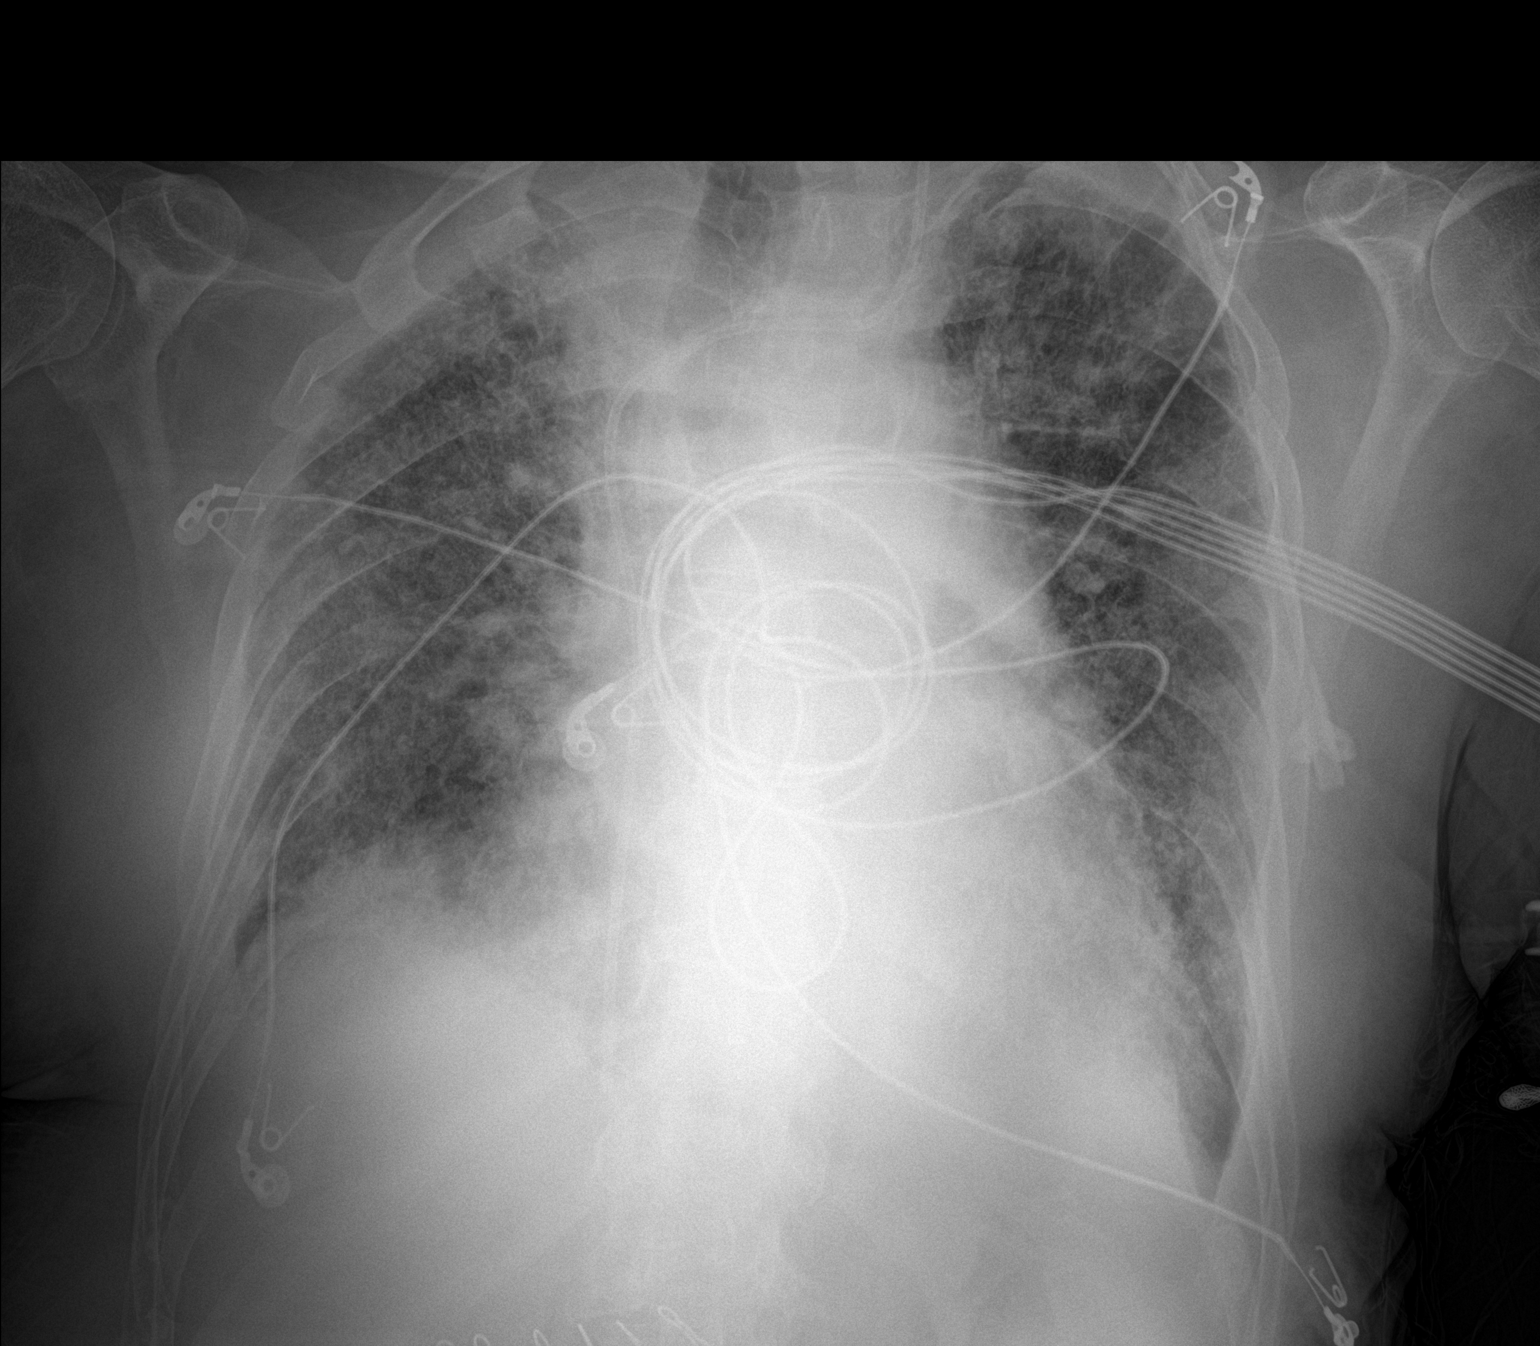

[1 of 1 positions shown; findings below may reference images not displayed]

FINDINGS: Portable AP semi upright view at 4553 hours. Stable left IJ approach
central line which terminates at the IVC level. Lung volumes at
significantly changed since 03/02/2021, with nonspecific mild
cardiomegaly and widening of the mediastinum. Tracheal air column
remains normal.

Coarse bilateral pulmonary interstitial opacity diffusely.
Ventilation appears mildly improved from yesterday, stable now
compared to 03/02/2021. No superimposed pneumothorax or pleural
effusion. No definite consolidation. Stable visualized osseous
structures.
IMPRESSION: 1. Mildly improved ventilation from yesterday but otherwise
unchanged diffuse coarse pulmonary interstitial opacity since
03/02/2021. Query postinflammatory pulmonary scarring.
2. Nonspecific widening of the mediastinal contour is stable.
3. Left IJ approach central line is stable, tip at the IVC/INFERIOR
cavoatrial junction level.

## 2022-02-05 IMAGING — DX DG CHEST 1V PORT
1 series · 1 of 1 positions shown · non-contrast
Comparison: March 10, 2021

CLINICAL DATA: Respiratory distress.

EXAM:
PORTABLE CHEST 1 VIEW

[chest ap]
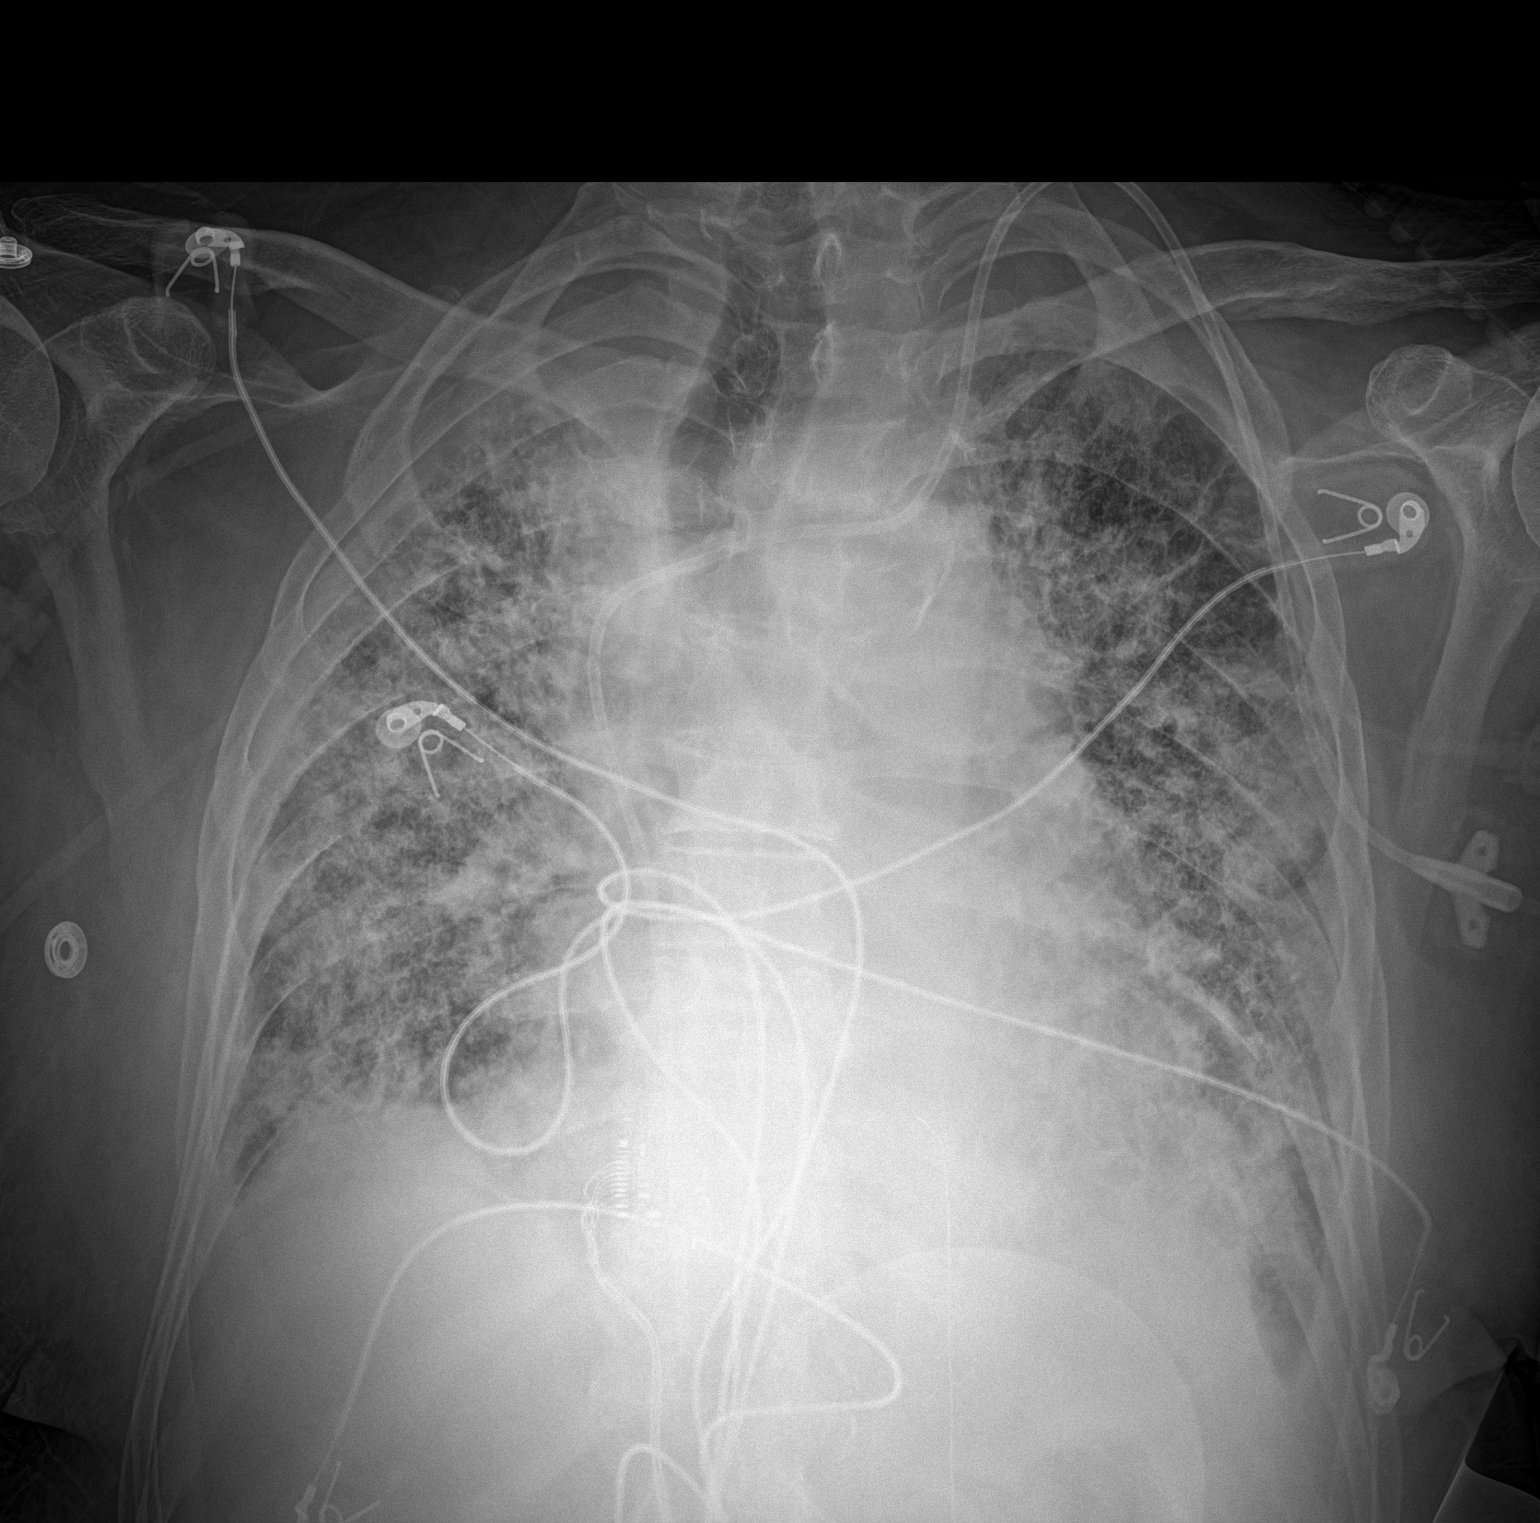

[1 of 1 positions shown; findings below may reference images not displayed]

FINDINGS: The left central line is stable. Stable cardiomegaly. The hila and
mediastinum are unchanged. Diffuse bilateral pulmonary opacities are
worsened in the interval. No pneumothorax. No other changes.
IMPRESSION: 1. Support apparatus as above.
2. Increasing bilateral pulmonary infiltrates may represent diffuse
edema versus multifocal diffuse pneumonia. Recommend clinical
correlation.
